# Patient Record
Sex: Female | Born: 1990 | Hispanic: No | Marital: Married | Smoking: Never smoker
Health system: Southern US, Community
[De-identification: ages and names within clinical notes are randomized; demographics above are authoritative.]

## PROBLEM LIST (undated history)

## (undated) ENCOUNTER — Inpatient Hospital Stay (HOSPITAL_COMMUNITY): Payer: Self-pay

## (undated) DIAGNOSIS — J45909 Unspecified asthma, uncomplicated: Secondary | ICD-10-CM

## (undated) DIAGNOSIS — Z789 Other specified health status: Secondary | ICD-10-CM

## (undated) DIAGNOSIS — E039 Hypothyroidism, unspecified: Secondary | ICD-10-CM

## (undated) DIAGNOSIS — E079 Disorder of thyroid, unspecified: Secondary | ICD-10-CM

## (undated) HISTORY — DX: Hypothyroidism, unspecified: E03.9

## (undated) HISTORY — PX: APPENDECTOMY: SHX54

## (undated) HISTORY — PX: TONSILLECTOMY: SUR1361

## (undated) HISTORY — DX: Unspecified asthma, uncomplicated: J45.909

---

## 2015-07-01 ENCOUNTER — Inpatient Hospital Stay (HOSPITAL_COMMUNITY)
Admission: AD | Admit: 2015-07-01 | Discharge: 2015-07-01 | Disposition: A | Discharge status: Home / Self Care | Payer: Medicaid Other | Source: Ambulatory Visit | Attending: Obstetrics & Gynecology | Admitting: Obstetrics & Gynecology

## 2015-07-01 ENCOUNTER — Inpatient Hospital Stay (HOSPITAL_COMMUNITY): Payer: Medicaid Other

## 2015-07-01 ENCOUNTER — Encounter (HOSPITAL_COMMUNITY): Payer: Self-pay | Admitting: *Deleted

## 2015-07-01 DIAGNOSIS — R102 Pelvic and perineal pain: Secondary | ICD-10-CM | POA: Insufficient documentation

## 2015-07-01 DIAGNOSIS — Z3A01 Less than 8 weeks gestation of pregnancy: Secondary | ICD-10-CM | POA: Diagnosis not present

## 2015-07-01 DIAGNOSIS — O9989 Other specified diseases and conditions complicating pregnancy, childbirth and the puerperium: Secondary | ICD-10-CM

## 2015-07-01 DIAGNOSIS — O26891 Other specified pregnancy related conditions, first trimester: Secondary | ICD-10-CM | POA: Insufficient documentation

## 2015-07-01 DIAGNOSIS — O26899 Other specified pregnancy related conditions, unspecified trimester: Secondary | ICD-10-CM

## 2015-07-01 DIAGNOSIS — R109 Unspecified abdominal pain: Secondary | ICD-10-CM

## 2015-07-01 HISTORY — DX: Other specified health status: Z78.9

## 2015-07-01 LAB — URINALYSIS, ROUTINE W REFLEX MICROSCOPIC
Bilirubin Urine: NEGATIVE
Glucose, UA: NEGATIVE mg/dL
Ketones, ur: NEGATIVE mg/dL
Nitrite: NEGATIVE
Protein, ur: NEGATIVE mg/dL
SPECIFIC GRAVITY, URINE: 1.01 (ref 1.005–1.030)
pH: 5.5 (ref 5.0–8.0)

## 2015-07-01 LAB — CBC
HEMATOCRIT: 34.4 % — AB (ref 36.0–46.0)
HEMOGLOBIN: 12 g/dL (ref 12.0–15.0)
MCH: 28.1 pg (ref 26.0–34.0)
MCHC: 34.9 g/dL (ref 30.0–36.0)
MCV: 80.6 fL (ref 78.0–100.0)
Platelets: 185 10*3/uL (ref 150–400)
RBC: 4.27 MIL/uL (ref 3.87–5.11)
RDW: 13.6 % (ref 11.5–15.5)
WBC: 6.6 10*3/uL (ref 4.0–10.5)

## 2015-07-01 LAB — URINE MICROSCOPIC-ADD ON: RBC / HPF: NONE SEEN RBC/hpf (ref 0–5)

## 2015-07-01 LAB — ABO/RH: ABO/RH(D): A POS

## 2015-07-01 LAB — HCG, QUANTITATIVE, PREGNANCY: hCG, Beta Chain, Quant, S: 2364 m[IU]/mL — ABNORMAL HIGH (ref <5)

## 2015-07-01 LAB — POCT PREGNANCY, URINE: PREG TEST UR: POSITIVE — AB

## 2015-07-01 NOTE — MAU Note (Signed)
Pt reports positive home preg test, reports pain in left lower abd and left lower back x one week. Pain worsened tonight.

## 2015-07-01 NOTE — MAU Provider Note (Signed)
History   [id number]   Chief Complaint  Patient presents with  . Pelvic Pain    HPI Casey Obrien is a 25 y.o. female  G1P0 here with report of lower pelvic pain that started a week ago and worsened in the past 24 hours.  Pain is located in lower midpelvic area and radiates to the lower back.  Denies vaginal bleeding or abnormal vaginal discharge.  No report of UTI symptoms.    Patient's last menstrual period was 06/01/2015.  OB History  Gravida Para Term Preterm AB SAB TAB Ectopic Multiple Living  1             # Outcome Date GA Lbr Len/2nd Weight Sex Delivery Anes PTL Lv  1 Current               Past Medical History  Diagnosis Date  . Medical history non-contributory     History reviewed. No pertinent family history.  Social History   Social History  . Marital Status: Married    Spouse Name: N/A  . Number of Children: N/A  . Years of Education: N/A   Social History Main Topics  . Smoking status: Never Smoker   . Smokeless tobacco: None  . Alcohol Use: No  . Drug Use: No  . Sexual Activity: Yes   Other Topics Concern  . None   Social History Narrative  . None    No Known Allergies  No current facility-administered medications on file prior to encounter.   No current outpatient prescriptions on file prior to encounter.     Review of Systems  Constitutional: Negative for fever and chills.  Gastrointestinal: Negative for nausea and vomiting.  Genitourinary: Positive for pelvic pain. Negative for dysuria, hematuria, vaginal bleeding and vaginal discharge.  Musculoskeletal: Positive for back pain.  All other systems reviewed and are negative.    Physical Exam   Filed Vitals:   07/01/15 0428  BP: 107/70  Pulse: 105  Temp: 98.3 F (36.8 C)  TempSrc: Oral  Resp: 20  Height: 4\' 11"  (1.499 m)  Weight: 132 lb (59.875 kg)  SpO2: 99%    Physical Exam  Constitutional: She is oriented to person, place, and time. She appears well-developed and  well-nourished. No distress.  Smiling during the exam  HENT:  Head: Normocephalic.  Neck: Normal range of motion. Neck supple.  Cardiovascular: Normal rate, regular rhythm and normal heart sounds.   Respiratory: Effort normal and breath sounds normal. No respiratory distress.  GI: Soft. She exhibits no mass. There is no tenderness. There is no rebound and no guarding.  Genitourinary: Right adnexum displays no mass, no tenderness and no fullness. Left adnexum displays no mass, no tenderness and no fullness. No bleeding in the vagina.  Musculoskeletal: Normal range of motion.  Neurological: She is alert and oriented to person, place, and time.  Skin: Skin is warm and dry.    MAU Course  Procedures  Results for orders placed or performed during the hospital encounter of 07/01/15 (from the past 24 hour(s))  Urinalysis, Routine w reflex microscopic (not [facility])     Status: Abnormal   Collection Time: 07/01/15  4:30 AM  Result Value Ref Range   Color, Urine YELLOW YELLOW   APPearance CLEAR CLEAR   Specific Gravity, Urine 1.010 1.005 - 1.030   pH 5.5 5.0 - 8.0   Glucose, UA NEGATIVE NEGATIVE mg/dL   Hgb urine dipstick TRACE (A) NEGATIVE   Bilirubin Urine NEGATIVE NEGATIVE  Ketones, ur NEGATIVE NEGATIVE mg/dL   Protein, ur NEGATIVE NEGATIVE mg/dL   Nitrite NEGATIVE NEGATIVE   Leukocytes, UA TRACE (A) NEGATIVE  Urine microscopic-add on     Status: Abnormal   Collection Time: 07/01/15  4:30 AM  Result Value Ref Range   Squamous Epithelial / LPF 0-5 (A) NONE SEEN   WBC, UA 0-5 0 - 5 WBC/hpf   RBC / HPF NONE SEEN 0 - 5 RBC/hpf   Bacteria, UA RARE (A) NONE SEEN  Pregnancy, urine POC     Status: Abnormal   Collection Time: 07/01/15  4:39 AM  Result Value Ref Range   Preg Test, Ur POSITIVE (A) NEGATIVE  CBC     Status: Abnormal   Collection Time: 07/01/15  4:56 AM  Result Value Ref Range   WBC 6.6 4.0 - 10.5 K/uL   RBC 4.27 3.87 - 5.11 MIL/uL   Hemoglobin 12.0 12.0 - 15.0 g/dL    HCT 65.7 (L) 84.6 - 46.0 %   MCV 80.6 78.0 - 100.0 fL   MCH 28.1 26.0 - 34.0 pg   MCHC 34.9 30.0 - 36.0 g/dL   RDW 96.2 95.2 - 84.1 %   Platelets 185 150 - 400 K/uL  hCG, quantitative, pregnancy     Status: Abnormal   Collection Time: 07/01/15  4:56 AM  Result Value Ref Range   hCG, Beta Chain, Quant, S 2364 (H) <5 mIU/mL  ABO/Rh     Status: None (Preliminary result)   Collection Time: 07/01/15  4:56 AM  Result Value Ref Range   ABO/RH(D) A POS    Ultrasound: IMPRESSION: 1. Intrauterine sac measuring 5 weeks 1 day. Yolk sac and embryo are not yet visible. 2. No adnexal mass or pelvic fluid.  Assessment and Plan  Pregnancy of Unknown Location - Probable IUP  Plan: Discharge home Follow-up [facility] on Wednesday at 1100 Reviewed ectopic precautions  All information communicated via language line interpreter [phone]  Marlis Edelson, CNM 07/01/2015 7:07 AM

## 2015-07-03 ENCOUNTER — Other Ambulatory Visit: Payer: Self-pay

## 2015-07-03 DIAGNOSIS — O3680X0 Pregnancy with inconclusive fetal viability, not applicable or unspecified: Secondary | ICD-10-CM

## 2015-07-04 LAB — HCG, QUANTITATIVE, PREGNANCY: HCG, BETA CHAIN, QUANT, S: 4465.9 m[IU]/mL — AB

## 2015-07-08 ENCOUNTER — Encounter: Payer: Self-pay | Admitting: General Practice

## 2015-07-08 DIAGNOSIS — Z3490 Encounter for supervision of normal pregnancy, unspecified, unspecified trimester: Secondary | ICD-10-CM

## 2015-07-08 NOTE — Progress Notes (Unsigned)
Patient came by clinic today with husband wanting test results. Informed her of [facility] results & repeat ultrasound scheduled for 6/14 with clinic appt to follow. She verbalized understanding & had no questions

## 2015-07-10 ENCOUNTER — Ambulatory Visit (HOSPITAL_COMMUNITY): Admission: RE | Admit: 2015-07-10 | Payer: Self-pay | Source: Ambulatory Visit

## 2015-07-10 ENCOUNTER — Encounter: Payer: Self-pay | Admitting: Family

## 2015-11-28 ENCOUNTER — Encounter (HOSPITAL_COMMUNITY): Payer: Self-pay

## 2015-11-28 ENCOUNTER — Inpatient Hospital Stay (HOSPITAL_COMMUNITY)
Admission: AD | Admit: 2015-11-28 | Discharge: 2015-11-28 | Disposition: A | Discharge status: Home / Self Care | Payer: Medicaid Other | Source: Ambulatory Visit | Attending: Obstetrics & Gynecology | Admitting: Obstetrics & Gynecology

## 2015-11-28 DIAGNOSIS — R233 Spontaneous ecchymoses: Secondary | ICD-10-CM | POA: Diagnosis not present

## 2015-11-28 DIAGNOSIS — R059 Cough, unspecified: Secondary | ICD-10-CM

## 2015-11-28 DIAGNOSIS — O26892 Other specified pregnancy related conditions, second trimester: Secondary | ICD-10-CM | POA: Diagnosis not present

## 2015-11-28 DIAGNOSIS — R05 Cough: Secondary | ICD-10-CM

## 2015-11-28 DIAGNOSIS — Z3A25 25 weeks gestation of pregnancy: Secondary | ICD-10-CM | POA: Insufficient documentation

## 2015-11-28 LAB — URINALYSIS, ROUTINE W REFLEX MICROSCOPIC
BILIRUBIN URINE: NEGATIVE
Glucose, UA: NEGATIVE mg/dL
Hgb urine dipstick: NEGATIVE
KETONES UR: NEGATIVE mg/dL
NITRITE: NEGATIVE
Protein, ur: NEGATIVE mg/dL
Specific Gravity, Urine: 1.01 (ref 1.005–1.030)
pH: 6 (ref 5.0–8.0)

## 2015-11-28 LAB — URINE MICROSCOPIC-ADD ON

## 2015-11-28 NOTE — Discharge Instructions (Signed)
Cough, Adult A cough helps to clear your throat and lungs. A cough may last only 2-3 weeks (acute), or it may last longer than 8 weeks (chronic). Many different things can cause a cough. A cough may be a sign of an illness or another medical condition. HOME CARE  Pay attention to any changes in your cough.  Take medicines only as told by your doctor.  If you were prescribed an antibiotic medicine, take it as told by your doctor. Do not stop taking it even if you start to feel better.  Talk with your doctor before you try using a cough medicine.  Drink enough fluid to keep your pee (urine) clear or pale yellow.  If the air is dry, use a cold steam vaporizer or humidifier in your home.  Stay away from things that make you cough at work or at home.  If your cough is worse at night, try using extra pillows to raise your head up higher while you sleep.  Do not smoke, and try not to be around smoke. If you need help quitting, ask your doctor.  Do not have caffeine.  Do not drink alcohol.  Rest as needed. GET HELP IF:  You have new problems (symptoms).  You cough up yellow fluid (pus).  Your cough does not get better after 2-3 weeks, or your cough gets worse.  Medicine does not help your cough and you are not sleeping well.  You have pain that gets worse or pain that is not helped with medicine.  You have a fever.  You are losing weight and you do not know why.  You have night sweats. GET HELP RIGHT AWAY IF:  You cough up blood.  You have trouble breathing.  Your heartbeat is very fast.   This information is not intended to replace advice given to you by your health care provider. Make sure you discuss any questions you have with your health care provider.   Document Released: 09/25/2010 Document Revised: 10/03/2014 Document Reviewed: 03/21/2014 Elsevier Interactive Patient Education 2016 Else[employer]. Guaifenesin oral solution and syrup What is this  medicine? GUAIFENESIN (gwye FEN e sin) is an expectorant. It helps to thin mucous and make coughs more productive. This medicine is used to treat coughs caused by colds or the flu. It is not intended to treat chronic cough caused by smoking, asthma, emphysema, or heart failure. This medicine may be used for other purposes; ask your health care provider or pharmacist if you have questions. What should I tell my health care provider before I take this medicine? They need to know if you have any of these conditions: -diabetes -fever -kidney disease -an unusual or allergic reaction to guaifenesin, other medicines, foods, dyes, or preservatives -pregnant or trying to get pregnant -breast-feeding How should I use this medicine? Take this medicine by mouth. Follow the directions on the prescription label. Use a specially marked spoon or container to measure your dose. Household spoons are not accurate. Take your medicine at regular intervals. Do not take it more often than directed. Talk to your pediatrician regarding the use of this medicine in children. Special care may be needed. Overdosage: If you think you have taken too much of this medicine contact a poison control center or emergency room at once. NOTE: This medicine is only for you. Do not share this medicine with others. What if I miss a dose? If you miss a dose, take it as soon as you can. If it  is almost time for your next dose, take only that dose. Do not take double or extra doses. What may interact with this medicine? Interactions are not expected. This list may not describe all possible interactions. Give your health care provider a list of all the medicines, herbs, non-prescription drugs, or dietary supplements you use. Also tell them if you smoke, drink alcohol, or use illegal drugs. Some items may interact with your medicine. What should I watch for while using this medicine? Do not treat a cough for more than 1 week without  consulting your doctor or health care professional. If you also have a high fever, skin rash, continuing headache, or sore throat, see your doctor. For best results, drink 6 to 8 glasses water daily while you are taking this medicine. What side effects may I notice from receiving this medicine? Side effects that you should report to your doctor or health care professional as soon as possible: -allergic reactions like skin rash, itching or hives, swelling of the face, lips, or tongue Side effects that usually do not require medical attention (report to your doctor or health care professional if they continue or are bothersome): -dizziness -headache -stomach upset This list may not describe all possible side effects. Call your doctor for medical advice about side effects. You may report side effects to FDA at 1-800-FDA-1088. Where should I keep my medicine? Keep out of the reach of children. Store at room temperature between 20 and 25 degrees C (68 and 77 degrees F). Do not freeze. Keep container tightly closed. Throw away any unused medicine after the expiration date. NOTE: This sheet is a summary. It may not cover all possible information. If you have questions about this medicine, talk to your doctor, pharmacist, or health care provider.    2016, Elsevier/Gold Standard. (2007-05-25 11:48:29)

## 2015-11-28 NOTE — MAU Provider Note (Signed)
History     CSN: [id number]  Arrival date and time: 11/28/15 0417   First Provider Initiated Contact with Patient 11/28/15 0502      Chief Complaint  Patient presents with  . Cough  . Epistaxis   Patient was seen and evaluated at the bedside. Patient presents to the MAU S/P completed 5 day of azithromycin for URI for N/V and cough. Patient states that earlier this morning she had a bout of nausea and vomiting which resulted in an episode of nosebleed and diffuse petechiae throughout her face with no other body involvement. Facial petechiae occurred 3 months ago after an episode of vomiting and subsequently resolved 3 days later. Patient would like to know what medicines are safe for her cough.     Past Medical History:  Diagnosis Date  . Medical history non-contributory     Past Surgical History:  Procedure Laterality Date  . APPENDECTOMY      No family history on file.  Social History  Substance Use Topics  . Smoking status: Never Smoker  . Smokeless tobacco: Not on file  . Alcohol use No    Allergies: No Known Allergies  Prescriptions Prior to Admission  Medication Sig Dispense Refill Last Dose  . azithromycin (ZITHROMAX) 250 MG tablet Take by mouth daily.   11/27/2015 at Unknown time  . loratadine (CLARITIN) 10 MG tablet Take 10 mg by mouth daily.   11/27/2015 at Unknown time    Review of Systems  Constitutional: Negative for chills.  Eyes: Negative for blurred vision.  Respiratory: Positive for cough. Negative for sputum production, shortness of breath and wheezing.   Cardiovascular: Negative for chest pain.  Gastrointestinal: Positive for nausea and vomiting. Negative for abdominal pain, constipation and diarrhea.  Genitourinary: Negative for dysuria.  Musculoskeletal: Negative for myalgias.  Skin: Positive for rash. Negative for itching.  Neurological: Negative for dizziness and headaches.  Psychiatric/Behavioral: Negative for depression.   Physical Exam    Blood pressure 102/70, pulse 110, temperature 98.4 F (36.9 C), temperature source Oral, resp. rate 16, height 5' (1.524 m), weight 146 lb (66.2 kg), last menstrual period 06/01/2015, SpO2 98 %.  Physical Exam  Constitutional: She is oriented to person, place, and time. She appears well-developed and well-nourished. She appears distressed.  HENT:  Head: Normocephalic and atraumatic.  Mouth/Throat: Oropharynx is clear and moist. No oropharyngeal exudate.  Eyes: Conjunctivae and EOM are normal.  Cardiovascular: Normal rate, regular rhythm and intact distal pulses.   Respiratory: Effort normal and breath sounds normal. No respiratory distress. She has no wheezes. She has no rales. She exhibits tenderness.  GI: There is no tenderness.  Musculoskeletal: Normal range of motion.  Neurological: She is alert and oriented to person, place, and time. She has normal reflexes.  Skin: She is not diaphoretic.  Diffuse petechiae through face  Psychiatric: She has a normal mood and affect. Her behavior is normal. Judgment and thought content normal.    MAU Course  Procedures  MDM Patient being evaluated for petechiae post vomiting in the setting of a non productive cough without fever, myalgias, shortness of breath, or sick contacts  Assessment and Plan  25 yo G1P0 @ 25 5/7 who presents to the MAU for Petechiae after vomiting. Petechiae improved during the evaluation with no signs or symptoms of bacterial URI. Will discharge patient with list of pregnancy safe medications for Cough and Nausea. Petechiae likely to resolve over the course of the next few days. Patient encouraged to return to  the MAU if symptoms fail to improve or if petechiae spread  Josue D Santos 11/28/2015, 5:35 AM   I have participated in the care of this patient and I agree with the above. Cam HaiSHAW, Neka Bise CNM 7:43 PM 11/29/2015

## 2015-11-28 NOTE — MAU Note (Signed)
Pt c/o cough x3 weeks. Coughed so much tonight that she vomited x1 and had nosebleed x1. States was prescribed cough medicine but has not helped. Sometimes coughing up mucous. Denies being around anyone sick or fever at home. Denies contractions or vag bleeding. +FM.

## 2016-05-05 ENCOUNTER — Encounter (HOSPITAL_COMMUNITY): Payer: Self-pay

## 2016-10-01 ENCOUNTER — Encounter (HOSPITAL_COMMUNITY): Payer: Self-pay | Admitting: *Deleted

## 2016-10-01 ENCOUNTER — Inpatient Hospital Stay (HOSPITAL_COMMUNITY)
Admission: AD | Admit: 2016-10-01 | Discharge: 2016-10-01 | Disposition: A | Discharge status: Home / Self Care | Payer: Medicaid Other | Source: Ambulatory Visit | Attending: Obstetrics and Gynecology | Admitting: Obstetrics and Gynecology

## 2016-10-01 DIAGNOSIS — Z3A01 Less than 8 weeks gestation of pregnancy: Secondary | ICD-10-CM | POA: Diagnosis not present

## 2016-10-01 DIAGNOSIS — O26891 Other specified pregnancy related conditions, first trimester: Secondary | ICD-10-CM | POA: Diagnosis not present

## 2016-10-01 DIAGNOSIS — R1010 Upper abdominal pain, unspecified: Secondary | ICD-10-CM | POA: Diagnosis present

## 2016-10-01 DIAGNOSIS — N898 Other specified noninflammatory disorders of vagina: Secondary | ICD-10-CM | POA: Insufficient documentation

## 2016-10-01 DIAGNOSIS — K219 Gastro-esophageal reflux disease without esophagitis: Secondary | ICD-10-CM | POA: Insufficient documentation

## 2016-10-01 DIAGNOSIS — O99611 Diseases of the digestive system complicating pregnancy, first trimester: Secondary | ICD-10-CM | POA: Insufficient documentation

## 2016-10-01 LAB — URINALYSIS, ROUTINE W REFLEX MICROSCOPIC
Bacteria, UA: NONE SEEN
Bilirubin Urine: NEGATIVE
GLUCOSE, UA: NEGATIVE mg/dL
Hgb urine dipstick: NEGATIVE
Ketones, ur: NEGATIVE mg/dL
Nitrite: NEGATIVE
PH: 6 (ref 5.0–8.0)
Protein, ur: NEGATIVE mg/dL
Specific Gravity, Urine: 1.024 (ref 1.005–1.030)

## 2016-10-01 LAB — CBC
HEMATOCRIT: 34.6 % — AB (ref 36.0–46.0)
HEMOGLOBIN: 12 g/dL (ref 12.0–15.0)
MCH: 27.1 pg (ref 26.0–34.0)
MCHC: 34.7 g/dL (ref 30.0–36.0)
MCV: 78.3 fL (ref 78.0–100.0)
PLATELETS: 189 10*3/uL (ref 150–400)
RBC: 4.42 MIL/uL (ref 3.87–5.11)
RDW: 13.5 % (ref 11.5–15.5)
WBC: 7.8 10*3/uL (ref 4.0–10.5)

## 2016-10-01 LAB — COMPREHENSIVE METABOLIC PANEL
ALK PHOS: 43 U/L (ref 38–126)
ALT: 14 U/L (ref 14–54)
AST: 12 U/L — AB (ref 15–41)
Albumin: 4 g/dL (ref 3.5–5.0)
Anion gap: 8 (ref 5–15)
BUN: 16 mg/dL (ref 6–20)
CALCIUM: 9.3 mg/dL (ref 8.9–10.3)
CHLORIDE: 105 mmol/L (ref 101–111)
CO2: 23 mmol/L (ref 22–32)
CREATININE: 0.56 mg/dL (ref 0.44–1.00)
GFR calc Af Amer: >60 mL/min (ref >60)
Glucose, Bld: 101 mg/dL — ABNORMAL HIGH (ref 65–99)
Potassium: 4.3 mmol/L (ref 3.5–5.1)
Sodium: 136 mmol/L (ref 135–145)
Total Bilirubin: 0.6 mg/dL (ref 0.3–1.2)
Total Protein: 7.1 g/dL (ref 6.5–8.1)

## 2016-10-01 LAB — WET PREP, GENITAL
Clue Cells Wet Prep HPF POC: NONE SEEN
SPERM: NONE SEEN
TRICH WET PREP: NONE SEEN
YEAST WET PREP: NONE SEEN

## 2016-10-01 LAB — POCT PREGNANCY, URINE: Preg Test, Ur: POSITIVE — AB

## 2016-10-01 LAB — LIPASE, BLOOD: LIPASE: 34 U/L (ref 11–51)

## 2016-10-01 MED ORDER — RANITIDINE HCL 150 MG PO TABS: 150.0000 mg | ORAL_TABLET | Freq: Every day | ORAL | 1 refills | Status: DC

## 2016-10-01 NOTE — MAU Provider Note (Signed)
History     CSN: [id number]  Arrival date and time: 10/01/16 1613   First Provider Initiated Contact with Patient 10/01/16 1708      Chief Complaint  Patient presents with  . Abdominal Pain  . Vaginal Discharge  . Vaginal Itching  . Possible Pregnancy   G2P1001 @[redacted]w[redacted]d  here with upper abdominal pain and vaginal itching. Abdominal pain started 2 mos ago and located in epigastric region. Pain only occurs after eating. Describes as squeezing. Some nausea since found out she was pregnant, no vomiting. No fevers. Also c/o vaginal itching and white discharge x1 week. No odor. No lower abdominal pain. No vaginal discharge.    OB History    Gravida Para Term Preterm AB Living   2         1   SAB TAB Ectopic Multiple Live Births           1      Past Medical History:  Diagnosis Date  . Medical history non-contributory     Past Surgical History:  Procedure Laterality Date  . APPENDECTOMY    . CESAREAN SECTION     cervix not dilating   . TONSILLECTOMY      No family history on file.  Social History  Substance Use Topics  . Smoking status: Never Smoker  . Smokeless tobacco: Never Used  . Alcohol use No    Allergies: No Known Allergies  Prescriptions Prior to Admission  Medication Sig Dispense Refill Last Dose  . loratadine (CLARITIN) 10 MG tablet Take 10 mg by mouth daily.   11/27/2015 at Unknown time    Review of Systems  Respiratory: Negative for chest tightness and shortness of breath.   Cardiovascular: Negative for chest pain.  Gastrointestinal: Positive for abdominal pain and nausea. Negative for vomiting.  Genitourinary: Positive for dysuria and frequency. Negative for hematuria and urgency.   Physical Exam   Blood pressure 107/61, pulse 98, temperature 97.9 F (36.6 C), temperature source Oral, resp. rate 16, height 5' 0.63" (1.54 m), weight 138 lb 14.2 oz (63 kg), last menstrual period 08/10/2016, unknown if currently breastfeeding.  Physical Exam   Constitutional: She is oriented to person, place, and time. She appears well-developed and well-nourished. No distress.  HENT:  Head: Normocephalic and atraumatic.  Neck: Normal range of motion.  Respiratory: Effort normal.  GI: Soft. She exhibits no distension and no mass. There is tenderness in the epigastric area. There is no rebound and no guarding.  Genitourinary:  Genitourinary Comments: External: no lesions or erythema Vagina: rugated, pink, moist, thin white discharge    Musculoskeletal: Normal range of motion.  Neurological: She is alert and oriented to person, place, and time.  Skin: Skin is warm and dry.  Psychiatric: She has a normal mood and affect.   Results for orders placed or performed during the hospital encounter of 10/01/16 (from the past 24 hour(s))  Wet prep, genital     Status: Abnormal   Collection Time: 10/01/16  4:46 PM  Result Value Ref Range   Yeast Wet Prep HPF POC NONE SEEN NONE SEEN   Trich, Wet Prep NONE SEEN NONE SEEN   Clue Cells Wet Prep HPF POC NONE SEEN NONE SEEN   WBC, Wet Prep HPF POC MANY (A) NONE SEEN   Sperm NONE SEEN   Urinalysis, Routine w reflex microscopic     Status: Abnormal   Collection Time: 10/01/16  5:00 PM  Result Value Ref Range   Color, Urine YELLOW  YELLOW   APPearance HAZY (A) CLEAR   Specific Gravity, Urine 1.024 1.005 - 1.030   pH 6.0 5.0 - 8.0   Glucose, UA NEGATIVE NEGATIVE mg/dL   Hgb urine dipstick NEGATIVE NEGATIVE   Bilirubin Urine NEGATIVE NEGATIVE   Ketones, ur NEGATIVE NEGATIVE mg/dL   Protein, ur NEGATIVE NEGATIVE mg/dL   Nitrite NEGATIVE NEGATIVE   Leukocytes, UA MODERATE (A) NEGATIVE   RBC / HPF 0-5 0 - 5 RBC/hpf   WBC, UA 0-5 0 - 5 WBC/hpf   Bacteria, UA NONE SEEN NONE SEEN   Squamous Epithelial / LPF 0-5 (A) NONE SEEN   Mucus PRESENT   Pregnancy, urine POC     Status: Abnormal   Collection Time: 10/01/16  5:07 PM  Result Value Ref Range   Preg Test, Ur POSITIVE (A) NEGATIVE  CBC     Status:  Abnormal   Collection Time: 10/01/16  5:32 PM  Result Value Ref Range   WBC 7.8 4.0 - 10.5 K/uL   RBC 4.42 3.87 - 5.11 MIL/uL   Hemoglobin 12.0 12.0 - 15.0 g/dL   HCT 16.1 (L) 09.6 - 04.5 %   MCV 78.3 78.0 - 100.0 fL   MCH 27.1 26.0 - 34.0 pg   MCHC 34.7 30.0 - 36.0 g/dL   RDW 40.9 81.1 - 91.4 %   Platelets 189 150 - 400 K/uL  Comprehensive metabolic panel     Status: Abnormal   Collection Time: 10/01/16  5:32 PM  Result Value Ref Range   Sodium 136 135 - 145 mmol/L   Potassium 4.3 3.5 - 5.1 mmol/L   Chloride 105 101 - 111 mmol/L   CO2 23 22 - 32 mmol/L   Glucose, Bld 101 (H) 65 - 99 mg/dL   BUN 16 6 - 20 mg/dL   Creatinine, Ser 7.82 0.44 - 1.00 mg/dL   Calcium 9.3 8.9 - 95.6 mg/dL   Total Protein 7.1 6.5 - 8.1 g/dL   Albumin 4.0 3.5 - 5.0 g/dL   AST 12 (L) 15 - 41 U/L   ALT 14 14 - 54 U/L   Alkaline Phosphatase 43 38 - 126 U/L   Total Bilirubin 0.6 0.3 - 1.2 mg/dL   GFR calc non Af Amer >60 >60 mL/min   GFR calc Af Amer >60 >60 mL/min   Anion gap 8 5 - 15  Lipase, blood     Status: None   Collection Time: 10/01/16  5:32 PM  Result Value Ref Range   Lipase 34 11 - 51 U/L   MAU Course  Procedures  MDM Labs ordered and reviewed. No evidence of emergent abdominal process. No evidence of vaginal infection or UTI. Upper abd pain likely GERD. Will Rx Zantac. Stable for discharge home.  Assessment and Plan   1. [redacted] weeks gestation of pregnancy   2. Vaginal discharge during pregnancy in first trimester   3. Gastroesophageal reflux disease without esophagitis    Discharge home Follow up with OBGYN provider of choice to start care B6 and Unisom prn nausea Rx Zantac Return precautions  Allergies as of 10/01/2016   No Known Allergies     Medication List    TAKE these medications   loratadine 10 MG tablet Commonly known as:  CLARITIN Take 10 mg by mouth daily.   ranitidine 150 MG tablet Commonly known as:  ZANTAC Take 1 tablet (150 mg total) by mouth at bedtime.             Discharge  Care Instructions        Start     Ordered   10/01/16 0000  Discharge patient    Question Answer Comment  Discharge disposition 01-Home or Self Care   Discharge patient date 10/01/2016      10/01/16 1816   10/01/16 0000  ranitidine (ZANTAC) 150 MG tablet  Daily at bedtime    Question:  Supervising Provider  Answer:  Catalina Antigua   10/01/16 1816     Donette Larry, CNM 10/01/2016, 5:28 PM

## 2016-10-01 NOTE — MAU Note (Addendum)
Pt presents to MAU c/o abdominal and vaginal pain that started around 2months ago. Pt states her pain worsened today which made her come to the hospital. Pt states the abdominal pain is 7-8 and worsens when she eats. Pt states her abdominal pain is directly under her sternum and like pressure, pain and feels like her stomach is getting squeezed.   Pt reports the pain in her vaginal area burns when she pees and she is having itching. Pt states the itching is inner and outer. Pt reports vaginal discharge that is white/yellow and thick.   Pt states her LMP was July 6th 2018

## 2016-10-01 NOTE — Discharge Instructions (Signed)
Heartburn During Pregnancy Heartburn is pain or discomfort in the throat or chest. It may cause a burning feeling. It happens when stomach acid moves up into the tube that carries food from your mouth to your stomach (esophagus). Heartburn is common during pregnancy. It usually goes away or gets better after giving birth. Follow these instructions at home: Eating and drinking  Do not drink alcohol while you are pregnant.  Figure out which foods and beverages make you feel worse, and avoid them.  Beverages that you may want to avoid include: ? Coffee and tea (with or without caffeine). ? Energy drinks and sports drinks. ? Bubbly (carbonated) drinks or sodas. ? Citrus fruit juices.  Foods that you may want to avoid include: ? Chocolate and cocoa. ? Peppermint and mint flavorings. ? Garlic, onions, and horseradish. ? Spicy and acidic foods. These include peppers, chili powder, curry powder, vinegar, hot sauces, and barbecue sauce. ? Citrus fruits, such as oranges, lemons, and limes. ? Tomato-based foods, such as red sauce, chili, and salsa. ? Fried and fatty foods, such as donuts, french fries, potato chips, and high-fat dressings. ? High-fat meats, such as hot dogs, cold cuts, sausage, ham, and bacon. ? High-fat dairy items, such as whole milk, butter, and cheese.  Eat small meals often, instead of large meals.  Avoid drinking a lot of liquid with your meals.  Avoid eating meals during the 2-3 hours before you go to bed.  Avoid lying down right after you eat.  Do not exercise right after you eat. Medicines  Take over-the-counter and prescription medicines only as told by your doctor.  Do not take aspirin, ibuprofen, or other NSAIDs unless your doctor tells you to do that.  Your doctor may tell you to avoid medicines that have sodium bicarbonate in them. General instructions  If told, raise the head of your bed about 6 inches (15 cm). You can do this by putting blocks under  the legs. Sleeping with more pillows does not help with heartburn.  Do not use any products that contain nicotine or tobacco, such as cigarettes and e-cigarettes. If you need help quitting, ask your doctor.  Wear loose-fitting clothing.  Try to lower your stress, such as with yoga or meditation. If you need help, ask your doctor.  Stay at a healthy weight. If you are overweight, work with your doctor to safely lose weight.  Keep all follow-up visits as told by your doctor. This is important. Contact a doctor if:  You get new symptoms.  Your symptoms do not get better with treatment.  You have weight loss and you do not know why.  You have trouble swallowing.  You make loud sounds when you breathe (wheeze).  You have a cough that does not go away.  You have heartburn often for more than 2 weeks.  You feel sick to your stomach (nauseous), and this does not get better with treatment.  You are throwing up (vomiting), and this does not get better with treatment.  You have pain in your belly (abdomen). Get help right away if:  You have very bad chest pain that spreads to your arm, neck, or jaw.  You feel sweaty, dizzy, or light-headed.  You have trouble breathing.  You have pain when swallowing.  You throw up and your throw-up looks like blood or coffee grounds.  Your poop (stool) is bloody or black. This information is not intended to replace advice given to you by your health care provider.  Make sure you discuss any questions you have with your health care provider. Document Released: 02/14/2010 Document Revised: 09/30/2015 Document Reviewed: 09/30/2015 Elsevier Interactive Patient Education  2017 [employer] Food Choices for Gastroesophageal Reflux Disease, Adult When you have gastroesophageal reflux disease (GERD), the foods you eat and your eating habits are very important. Choosing the right foods can help ease your discomfort. What guidelines do I need to  follow?  Choose fruits, vegetables, whole grains, and low-fat dairy products.  Choose low-fat meat, fish, and poultry.  Limit fats such as oils, salad dressings, butter, nuts, and avocado.  Keep a food diary. This helps you identify foods that cause symptoms.  Avoid foods that cause symptoms. These may be different for everyone.  Eat small meals often instead of 3 large meals a day.  Eat your meals slowly, in a place where you are relaxed.  Limit fried foods.  Cook foods using methods other than frying.  Avoid drinking alcohol.  Avoid drinking large amounts of liquids with your meals.  Avoid bending over or lying down until 2-3 hours after eating. What foods are not recommended? These are some foods and drinks that may make your symptoms worse: Vegetables Tomatoes. Tomato juice. Tomato and spaghetti sauce. Chili peppers. Onion and garlic. Horseradish. Fruits Oranges, grapefruit, and lemon (fruit and juice). Meats High-fat meats, fish, and poultry. This includes hot dogs, ribs, ham, sausage, salami, and bacon. Dairy Whole milk and chocolate milk. Sour cream. Cream. Butter. Ice cream. Cream cheese. Drinks Coffee and tea. Bubbly (carbonated) drinks or energy drinks. Condiments Hot sauce. Barbecue sauce. Sweets/Desserts Chocolate and cocoa. Donuts. Peppermint and spearmint. Fats and Oils High-fat foods. This includes [place] fries and potato chips. Other Vinegar. Strong spices. This includes black pepper, white pepper, red pepper, cayenne, curry powder, cloves, ginger, and chili powder. The items listed above may not be a complete list of foods and drinks to avoid. Contact your dietitian for more information. This information is not intended to replace advice given to you by your health care provider. Make sure you discuss any questions you have with your health care provider. Document Released: 07/14/2011 Document Revised: 06/20/2015 Document Reviewed: 11/16/2012 Elsevier  Interactive Patient Education  2017 [employer]

## 2016-10-02 LAB — GC/CHLAMYDIA PROBE AMP (~~LOC~~) NOT AT ARMC
Chlamydia: NEGATIVE
NEISSERIA GONORRHEA: NEGATIVE

## 2016-10-19 ENCOUNTER — Inpatient Hospital Stay (HOSPITAL_COMMUNITY)
Admission: AD | Admit: 2016-10-19 | Discharge: 2016-10-19 | Disposition: A | Discharge status: Home / Self Care | Payer: Medicaid Other | Source: Ambulatory Visit | Attending: Family Medicine | Admitting: Family Medicine

## 2016-10-19 ENCOUNTER — Encounter (HOSPITAL_COMMUNITY): Payer: Self-pay

## 2016-10-19 DIAGNOSIS — O219 Vomiting of pregnancy, unspecified: Secondary | ICD-10-CM

## 2016-10-19 DIAGNOSIS — O210 Mild hyperemesis gravidarum: Secondary | ICD-10-CM | POA: Diagnosis present

## 2016-10-19 DIAGNOSIS — Z3A10 10 weeks gestation of pregnancy: Secondary | ICD-10-CM | POA: Insufficient documentation

## 2016-10-19 LAB — COMPREHENSIVE METABOLIC PANEL
ALBUMIN: 4.1 g/dL (ref 3.5–5.0)
ALK PHOS: 43 U/L (ref 38–126)
ALT: 12 U/L — ABNORMAL LOW (ref 14–54)
ANION GAP: 12 (ref 5–15)
AST: 15 U/L (ref 15–41)
BUN: 10 mg/dL (ref 6–20)
CALCIUM: 9 mg/dL (ref 8.9–10.3)
CHLORIDE: 104 mmol/L (ref 101–111)
CO2: 20 mmol/L — AB (ref 22–32)
Creatinine, Ser: 0.47 mg/dL (ref 0.44–1.00)
GFR calc non Af Amer: >60 mL/min (ref >60)
GLUCOSE: 79 mg/dL (ref 65–99)
Potassium: 3.6 mmol/L (ref 3.5–5.1)
SODIUM: 136 mmol/L (ref 135–145)
Total Bilirubin: 1.3 mg/dL — ABNORMAL HIGH (ref 0.3–1.2)
Total Protein: 7.2 g/dL (ref 6.5–8.1)

## 2016-10-19 LAB — URINALYSIS, ROUTINE W REFLEX MICROSCOPIC
BACTERIA UA: NONE SEEN
Bilirubin Urine: NEGATIVE
GLUCOSE, UA: 50 mg/dL — AB
Hgb urine dipstick: NEGATIVE
KETONES UR: 80 mg/dL — AB
Nitrite: NEGATIVE
PROTEIN: 30 mg/dL — AB
Specific Gravity, Urine: 1.026 (ref 1.005–1.030)
pH: 6 (ref 5.0–8.0)

## 2016-10-19 LAB — CBC
HEMATOCRIT: 36.1 % (ref 36.0–46.0)
HEMOGLOBIN: 12.3 g/dL (ref 12.0–15.0)
MCH: 26.7 pg (ref 26.0–34.0)
MCHC: 34.1 g/dL (ref 30.0–36.0)
MCV: 78.5 fL (ref 78.0–100.0)
Platelets: 184 10*3/uL (ref 150–400)
RBC: 4.6 MIL/uL (ref 3.87–5.11)
RDW: 13.9 % (ref 11.5–15.5)
WBC: 6.4 10*3/uL (ref 4.0–10.5)

## 2016-10-19 MED ORDER — METOCLOPRAMIDE HCL 10 MG PO TABS: 10.0000 mg | ORAL_TABLET | Freq: Three times a day (TID) | ORAL | 1 refills | Status: DC

## 2016-10-19 MED ORDER — METOCLOPRAMIDE HCL 5 MG/ML IJ SOLN
10.0000 mg | Freq: Once | INTRAMUSCULAR | Status: AC
  Administered 2016-10-19: 10 mg via INTRAVENOUS
  Filled 2016-10-19: qty 2

## 2016-10-19 MED ORDER — FAMOTIDINE IN NACL 20-0.9 MG/50ML-% IV SOLN
20.0000 mg | Freq: Once | INTRAVENOUS | Status: AC
  Administered 2016-10-19: 20 mg via INTRAVENOUS
  Filled 2016-10-19: qty 50

## 2016-10-19 MED ORDER — LACTATED RINGERS IV BOLUS (SEPSIS)
1000.0000 mL | Freq: Once | INTRAVENOUS | Status: AC
  Administered 2016-10-19: 1000 mL via INTRAVENOUS

## 2016-10-19 MED ORDER — DEXTROSE 5 % IN LACTATED RINGERS IV BOLUS
1000.0000 mL | Freq: Once | INTRAVENOUS | Status: AC
  Administered 2016-10-19: 1000 mL via INTRAVENOUS

## 2016-10-19 NOTE — MAU Provider Note (Signed)
History     CSN: [id number]  Arrival date and time: 10/19/16 1240   First Provider Initiated Contact with Patient 10/19/16 1411      Chief Complaint  Patient presents with  . Emesis During Pregnancy  . Loss of Consciousness   HPI   Casey Obrien is a 26 y.o. female G2P0 @ 39w0dhere in MAU with N/V. States she has vomited 7 times in the last 24 hours. She is taking unsiom and B6 every night before bed and zantac. Denies fever or diarrhea. She has not started prenatal care. States she has occasional lower abdominal cramping that comes and goes. Vomiting makes the abdominal pain worse. Denies vaginal bleeding.   OB History    Gravida Para Term Preterm AB Living   2         1   SAB TAB Ectopic Multiple Live Births           1      Past Medical History:  Diagnosis Date  . Medical history non-contributory     Past Surgical History:  Procedure Laterality Date  . APPENDECTOMY    . CESAREAN SECTION     cervix not dilating   . TONSILLECTOMY      History reviewed. No pertinent family history.  Social History  Substance Use Topics  . Smoking status: Never Smoker  . Smokeless tobacco: Never Used  . Alcohol use No    Allergies: No Known Allergies  Prescriptions Prior to Admission  Medication Sig Dispense Refill Last Dose  . loratadine (CLARITIN) 10 MG tablet Take 10 mg by mouth daily.   11/27/2015 at Unknown time  . ranitidine (ZANTAC) 150 MG tablet Take 1 tablet (150 mg total) by mouth at bedtime. 30 tablet 1    Results for orders placed or performed during the hospital encounter of 10/19/16 (from the past 48 hour(s))  Comprehensive metabolic panel     Status: Abnormal   Collection Time: 10/19/16  1:53 PM  Result Value Ref Range   Sodium 136 135 - 145 mmol/L   Potassium 3.6 3.5 - 5.1 mmol/L   Chloride 104 101 - 111 mmol/L   CO2 20 (L) 22 - 32 mmol/L   Glucose, Bld 79 65 - 99 mg/dL   BUN 10 6 - 20 mg/dL   Creatinine, Ser 0.47 0.44 - 1.00 mg/dL   Calcium 9.0 8.9 -  10.3 mg/dL   Total Protein 7.2 6.5 - 8.1 g/dL   Albumin 4.1 3.5 - 5.0 g/dL   AST 15 15 - 41 U/L   ALT 12 (L) 14 - 54 U/L   Alkaline Phosphatase 43 38 - 126 U/L   Total Bilirubin 1.3 (H) 0.3 - 1.2 mg/dL   GFR calc non Af Amer >60 >60 mL/min   GFR calc Af Amer >60 >60 mL/min    Comment: (NOTE) The eGFR has been calculated using the CKD EPI equation. This calculation has not been validated in all clinical situations. eGFR's persistently <60 mL/min signify possible Chronic Kidney Disease.    Anion gap 12 5 - 15  CBC     Status: None   Collection Time: 10/19/16  1:53 PM  Result Value Ref Range   WBC 6.4 4.0 - 10.5 K/uL   RBC 4.60 3.87 - 5.11 MIL/uL   Hemoglobin 12.3 12.0 - 15.0 g/dL   HCT 36.1 36.0 - 46.0 %   MCV 78.5 78.0 - 100.0 fL   MCH 26.7 26.0 - 34.0 pg   MCHC  34.1 30.0 - 36.0 g/dL   RDW 13.9 11.5 - 15.5 %   Platelets 184 150 - 400 K/uL  Urinalysis, Routine w reflex microscopic     Status: Abnormal   Collection Time: 10/19/16  4:10 PM  Result Value Ref Range   Color, Urine YELLOW YELLOW   APPearance HAZY (A) CLEAR   Specific Gravity, Urine 1.026 1.005 - 1.030   pH 6.0 5.0 - 8.0   Glucose, UA 50 (A) NEGATIVE mg/dL   Hgb urine dipstick NEGATIVE NEGATIVE   Bilirubin Urine NEGATIVE NEGATIVE   Ketones, ur 80 (A) NEGATIVE mg/dL   Protein, ur 30 (A) NEGATIVE mg/dL   Nitrite NEGATIVE NEGATIVE   Leukocytes, UA LARGE (A) NEGATIVE   RBC / HPF 0-5 0 - 5 RBC/hpf   WBC, UA 6-30 0 - 5 WBC/hpf   Bacteria, UA NONE SEEN NONE SEEN   Squamous Epithelial / LPF 6-30 (A) NONE SEEN   Mucus PRESENT    Review of Systems  Constitutional: Negative for fever.  Gastrointestinal: Positive for abdominal pain (Cramping in lower abdomen), nausea and vomiting. Negative for diarrhea.   Physical Exam   Blood pressure 119/60, pulse 93, temperature 98.6 F (37 C), temperature source Oral, resp. rate 18, last menstrual period 08/10/2016, SpO2 100 %, unknown if currently breastfeeding.  Physical  Exam  Constitutional: She is oriented to person, place, and time. She appears well-developed and well-nourished. No distress.  HENT:  Head: Normocephalic.  Eyes: Pupils are equal, round, and reactive to light.  Respiratory: Effort normal.  GI: Soft. Normal appearance. There is tenderness in the suprapubic area. There is no rigidity, no rebound and no guarding.  Musculoskeletal: Normal range of motion.  Neurological: She is alert and oriented to person, place, and time.  Skin: Skin is warm. She is not diaphoretic.  Psychiatric: Her behavior is normal.    MAU Course  Procedures  None  MDM  + fetal heart tones via doppler CMP LR bolus X1 D5LR bolus X 1 Pepcid, Reglan IV Patient feeling much better after IV fluids, ready to go home Urine culture pending   Assessment and Plan   A:  1. Nausea and vomiting in pregnancy prior to [redacted] weeks gestation     P:  Discharge home in stable condition Rx: Reglan  Return to MAU as needed, if symptoms worsen Small, frequent meals  Start prenatal care.    Lezlie Lye, NP 10/19/2016 5:17 PM

## 2016-10-19 NOTE — Discharge Instructions (Signed)
Nausea and Vomiting, Adult Nausea is the feeling that you have an upset stomach or have to vomit. As nausea gets worse, it can lead to vomiting. Vomiting occurs when stomach contents are thrown up and out of the mouth. Vomiting can make you feel weak and cause you to become dehydrated. Dehydration can make you tired and thirsty, cause you to have a dry mouth, and decrease how often you urinate. Older adults and people with other diseases or a weak immune system are at higher risk for dehydration. It is important to treat your nausea and vomiting as told by your health care provider. Follow these instructions at home: Follow instructions from your health care provider about how to care for yourself at home. Eating and drinking Follow these recommendations as told by your health care provider:  Take an oral rehydration solution (ORS). This is a drink that is sold at pharmacies and retail stores.  Drink clear fluids in small amounts as you are able. Clear fluids include water, ice chips, diluted fruit juice, and low-calorie sports drinks.  Eat bland, easy-to-digest foods in small amounts as you are able. These foods include bananas, applesauce, rice, lean meats, toast, and crackers.  Avoid fluids that contain a lot of sugar or caffeine, such as energy drinks, sports drinks, and soda.  Avoid alcohol.  Avoid spicy or fatty foods.  General instructions  Drink enough fluid to keep your urine clear or pale yellow.  Wash your hands often. If soap and water are not available, use hand sanitizer.  Make sure that all people in your household wash their hands well and often.  Take over-the-counter and prescription medicines only as told by your health care provider.  Rest at home while you recover.  Watch your condition for any changes.  Breathe slowly and deeply when you feel nauseated.  Keep all follow-up visits as told by your health care provider. This is important. Contact a health care  provider if:  You have a fever.  You cannot keep fluids down.  Your symptoms get worse.  You have new symptoms.  Your nausea does not go away after two days.  You feel light-headed or dizzy.  You have a headache.  You have muscle cramps. Get help right away if:  You have pain in your chest, neck, arm, or jaw.  You feel extremely weak or you faint.  You have persistent vomiting.  You see blood in your vomit.  Your vomit looks like black coffee grounds.  You have bloody or black stools or stools that look like tar.  You have a severe headache, a stiff neck, or both.  You have a rash.  You have severe pain, cramping, or bloating in your abdomen.  You have trouble breathing or you are breathing very quickly.  Your heart is beating very quickly.  Your skin feels cold and clammy.  You feel confused.  You have pain when you urinate.  You have signs of dehydration, such as: ? Dark urine, very little urine, or no urine. ? Cracked lips. ? Dry mouth. ? Sunken eyes. ? Sleepiness. ? Weakness. These symptoms may represent a serious problem that is an emergency. Do not wait to see if the symptoms will go away. Get medical help right away. Call your local emergency services (911 in the [place] Do not drive yourself to the hospital. This information is not intended to replace advice given to you by your health care provider. Make sure you discuss any questions you   have with your health care provider. Document Released: 01/12/2005 Document Revised: 06/17/2015 Document Reviewed: 09/18/2014 Elsevier Interactive Patient Education  2017 Elsevier Inc.  

## 2016-10-19 NOTE — MAU Note (Signed)
Pt has had N/V since last night. Husband stated she has no energy. Felt like she was gong to pass out.  Husband started to carry her int triage because she felt like she could not walk.Her throat is sore from vomiting.

## 2016-10-20 LAB — CULTURE, OB URINE
Culture: <10000 — AB
Special Requests: NORMAL

## 2016-12-03 ENCOUNTER — Ambulatory Visit (INDEPENDENT_AMBULATORY_CARE_PROVIDER_SITE_OTHER): Payer: Medicaid Other | Admitting: Student

## 2016-12-03 ENCOUNTER — Other Ambulatory Visit (HOSPITAL_COMMUNITY)
Admission: RE | Admit: 2016-12-03 | Discharge: 2016-12-03 | Disposition: A | Discharge status: Home / Self Care | Payer: Medicaid Other | Source: Ambulatory Visit | Attending: Student | Admitting: Student

## 2016-12-03 ENCOUNTER — Encounter: Payer: Self-pay | Admitting: Student

## 2016-12-03 VITALS — BP 105/62 | HR 85 | Wt 141.3 lb

## 2016-12-03 DIAGNOSIS — Z98891 History of uterine scar from previous surgery: Secondary | ICD-10-CM | POA: Insufficient documentation

## 2016-12-03 DIAGNOSIS — Z3482 Encounter for supervision of other normal pregnancy, second trimester: Secondary | ICD-10-CM

## 2016-12-03 DIAGNOSIS — Z3A16 16 weeks gestation of pregnancy: Secondary | ICD-10-CM | POA: Insufficient documentation

## 2016-12-03 DIAGNOSIS — Z3480 Encounter for supervision of other normal pregnancy, unspecified trimester: Secondary | ICD-10-CM | POA: Insufficient documentation

## 2016-12-03 LAB — POCT URINALYSIS DIP (DEVICE)
Bilirubin Urine: NEGATIVE
Glucose, UA: NEGATIVE mg/dL
Hgb urine dipstick: NEGATIVE
Ketones, ur: NEGATIVE mg/dL
Nitrite: NEGATIVE
Protein, ur: NEGATIVE mg/dL
Specific Gravity, Urine: 1.01 (ref 1.005–1.030)
Urobilinogen, UA: 0.2 mg/dL (ref 0.0–1.0)
pH: 6 (ref 5.0–8.0)

## 2016-12-03 NOTE — Progress Notes (Signed)
  Subjective:    Casey Obrien is being seen today for her first obstetrical visit.  This is a planned pregnancy. She is at 7826w3d gestation. Her obstetrical history is significant for none. . Relationship with FOB: spouse, living together. Patient does intend to breast feed. Pregnancy history fully reviewed.  Patient reports no complaints.  Review of Systems:   Review of Systems  All other systems reviewed and are negative.   Objective:     BP 105/62   Pulse 85   Wt 141 lb 4.8 oz (64.1 kg)   LMP 08/10/2016 (Approximate)   BMI 27.03 kg/m  Physical Exam  Constitutional: She is oriented to person, place, and time. She appears well-developed.  HENT:  Head: Normocephalic.  Neck: Normal range of motion.  Respiratory: Effort normal.  GI: Soft.  Genitourinary:  Genitourinary Comments: NEFG: breast exam benign no lumps or masses.   Musculoskeletal: Normal range of motion.  Neurological: She is alert and oriented to person, place, and time.  Skin: Skin is warm and dry.    Exam    Assessment:    Pregnancy: G2P1001 Patient Active Problem List   Diagnosis Date Noted  . Supervision of other normal pregnancy, antepartum 12/03/2016  . History of C-section 12/03/2016       Plan:     Initial labs drawn. Prenatal vitamins. Problem list reviewed and updated. AFP3 discussed: declined. Role of ultrasound in pregnancy discussed; fetal survey: requested. Amniocentesis discussed: not indicated. Follow up in 4 weeks. 50% of 30 min visit spent on counseling and coordination of care.  -Patient said that she had normal pap last year, although do not see documentation in Care Everywhre. Will do pp if necessary.  -OB history Records available in Care Everywhere  Charlesetta GaribaldiKathryn Lorraine [facility] 12/03/2016

## 2016-12-03 NOTE — Patient Instructions (Signed)

## 2016-12-04 LAB — OBSTETRIC PANEL, INCLUDING HIV
Antibody Screen: NEGATIVE
BASOS ABS: 0 10*3/uL (ref 0.0–0.2)
Basos: 0 %
EOS (ABSOLUTE): 0.1 10*3/uL (ref 0.0–0.4)
Eos: 1 %
HEMOGLOBIN: 11.2 g/dL (ref 11.1–15.9)
HIV SCREEN 4TH GENERATION: NONREACTIVE
Hematocrit: 33.6 % — ABNORMAL LOW (ref 34.0–46.6)
Hepatitis B Surface Ag: NEGATIVE
Immature Grans (Abs): 0 10*3/uL (ref 0.0–0.1)
Immature Granulocytes: 0 %
LYMPHS ABS: 1.8 10*3/uL (ref 0.7–3.1)
Lymphs: 25 %
MCH: 27 pg (ref 26.6–33.0)
MCHC: 33.3 g/dL (ref 31.5–35.7)
MCV: 81 fL (ref 79–97)
MONOCYTES: 6 %
Monocytes Absolute: 0.4 10*3/uL (ref 0.1–0.9)
NEUTROS ABS: 4.9 10*3/uL (ref 1.4–7.0)
NEUTROS PCT: 68 %
PLATELETS: 175 10*3/uL (ref 150–379)
RBC: 4.15 x10E6/uL (ref 3.77–5.28)
RDW: 16.3 % — AB (ref 12.3–15.4)
RPR Ser Ql: NONREACTIVE
Rh Factor: POSITIVE
Rubella Antibodies, IGG: 7.58 index (ref >0.99)
WBC: 7.1 10*3/uL (ref 3.4–10.8)

## 2016-12-04 LAB — GC/CHLAMYDIA PROBE AMP (~~LOC~~) NOT AT ARMC
Chlamydia: NEGATIVE
Neisseria Gonorrhea: NEGATIVE

## 2016-12-05 LAB — URINE CULTURE, OB REFLEX

## 2016-12-05 LAB — CULTURE, OB URINE

## 2016-12-14 LAB — CYSTIC FIBROSIS MUTATION 97: Interpretation: NOT DETECTED

## 2016-12-16 ENCOUNTER — Inpatient Hospital Stay (HOSPITAL_COMMUNITY): Payer: Medicaid Other

## 2016-12-16 ENCOUNTER — Inpatient Hospital Stay (HOSPITAL_COMMUNITY)
Admission: AD | Admit: 2016-12-16 | Discharge: 2016-12-17 | Disposition: A | Discharge status: Home / Self Care | Payer: Medicaid Other | Source: Ambulatory Visit | Attending: Obstetrics & Gynecology | Admitting: Obstetrics & Gynecology

## 2016-12-16 DIAGNOSIS — Z833 Family history of diabetes mellitus: Secondary | ICD-10-CM | POA: Insufficient documentation

## 2016-12-16 DIAGNOSIS — R58 Hemorrhage, not elsewhere classified: Secondary | ICD-10-CM

## 2016-12-16 DIAGNOSIS — Z3A18 18 weeks gestation of pregnancy: Secondary | ICD-10-CM | POA: Diagnosis not present

## 2016-12-16 DIAGNOSIS — N939 Abnormal uterine and vaginal bleeding, unspecified: Secondary | ICD-10-CM | POA: Diagnosis present

## 2016-12-16 DIAGNOSIS — Z3480 Encounter for supervision of other normal pregnancy, unspecified trimester: Secondary | ICD-10-CM

## 2016-12-16 DIAGNOSIS — Z8249 Family history of ischemic heart disease and other diseases of the circulatory system: Secondary | ICD-10-CM | POA: Diagnosis not present

## 2016-12-16 DIAGNOSIS — O34219 Maternal care for unspecified type scar from previous cesarean delivery: Secondary | ICD-10-CM | POA: Insufficient documentation

## 2016-12-16 DIAGNOSIS — O209 Hemorrhage in early pregnancy, unspecified: Secondary | ICD-10-CM | POA: Diagnosis not present

## 2016-12-16 LAB — URINALYSIS, ROUTINE W REFLEX MICROSCOPIC
BACTERIA UA: NONE SEEN
BILIRUBIN URINE: NEGATIVE
GLUCOSE, UA: NEGATIVE mg/dL
Ketones, ur: NEGATIVE mg/dL
NITRITE: NEGATIVE
PROTEIN: NEGATIVE mg/dL
Specific Gravity, Urine: 1.029 (ref 1.005–1.030)
pH: 6 (ref 5.0–8.0)

## 2016-12-16 LAB — WET PREP, GENITAL
Clue Cells Wet Prep HPF POC: NONE SEEN
SPERM: NONE SEEN
Trich, Wet Prep: NONE SEEN
YEAST WET PREP: NONE SEEN

## 2016-12-16 NOTE — MAU Note (Signed)
Pt here with c/o vaginal bleeding. Having some cramping.

## 2016-12-17 DIAGNOSIS — O209 Hemorrhage in early pregnancy, unspecified: Secondary | ICD-10-CM | POA: Diagnosis not present

## 2016-12-17 NOTE — MAU Note (Signed)
Pt returned from US, provider reviewed results with patient and told pt to wait for D/C instructions, however pt did not wait and left with out D/C vitals and paperwork.

## 2016-12-17 NOTE — MAU Provider Note (Signed)
Patient Casey Obrien is a 26 y.o. G2P1001  At 1880w3d here with complaints of bleeding this evening. She denies other ob-gyn complaints. She gets her prenatal care at [facility];  Her ob history is significant for prior c-section.  The bleeding began this evening after she had intercourse this morning.  History     CSN: [id number]  Arrival date and time: 12/16/16 2216   None     Chief Complaint  Patient presents with  . Vaginal Bleeding   Vaginal Bleeding  The patient's primary symptoms include vaginal bleeding. The patient's pertinent negatives include no genital itching, genital lesions, genital odor or pelvic pain. This is a new problem. The current episode started today. The problem occurs intermittently. Pertinent negatives include no abdominal pain, anorexia, diarrhea or discolored urine. The vaginal discharge was normal. The vaginal bleeding is spotting. She has not been passing clots. She has not been passing tissue.  Patient had some bleeding and spotting (bright red blood and some brown blood) when she wiped this evening. Patient has not had to change her pad or wear a pantyline. She and her husband were concerned so they came to the MAU. She has had a few light cramps off and on over the past week.   OB History    Gravida Para Term Preterm AB Living   2 1 1     1    SAB TAB Ectopic Multiple Live Births           1      Past Medical History:  Diagnosis Date  . Medical history non-contributory     Past Surgical History:  Procedure Laterality Date  . APPENDECTOMY    . CESAREAN SECTION     cervix not dilating   . TONSILLECTOMY      Family History  Problem Relation Age of Onset  . Hypertension Mother   . Heart disease Father   . Diabetes Father   . Hypertension Father     Social History   Tobacco Use  . Smoking status: Never Smoker  . Smokeless tobacco: Never Used  Substance Use Topics  . Alcohol use: No  . Drug use: No    Allergies: No Known  Allergies  Medications Prior to Admission  Medication Sig Dispense Refill Last Dose  . loratadine (CLARITIN) 10 MG tablet Take 10 mg by mouth daily.   Not Taking  . metoCLOPramide (REGLAN) 10 MG tablet Take 1 tablet (10 mg total) by mouth 4 (four) times daily -  before meals and at bedtime. (Patient not taking: Reported on 12/03/2016) 30 tablet 1 Not Taking  . ranitidine (ZANTAC) 150 MG tablet Take 1 tablet (150 mg total) by mouth at bedtime. (Patient not taking: Reported on 12/03/2016) 30 tablet 1 Not Taking    Review of Systems  HENT: Negative.   Respiratory: Negative.   Gastrointestinal: Negative for abdominal pain, anorexia and diarrhea.  Genitourinary: Positive for vaginal bleeding. Negative for pelvic pain.  Neurological: Negative.   Psychiatric/Behavioral: Negative.    Physical Exam   Blood pressure 93/62, pulse 91, temperature 98 F (36.7 C), temperature source Oral, resp. rate 18, height 5' 0.63" (1.54 m), weight 144 lb (65.3 kg), last menstrual period 08/10/2016, SpO2 99 %, unknown if currently breastfeeding.  Physical Exam  Constitutional: She is oriented to person, place, and time. She appears well-developed.  HENT:  Head: Normocephalic.  Eyes: Pupils are equal, round, and reactive to light.  Respiratory: Effort normal.  GI: Soft. She exhibits no distension  and no mass. There is no tenderness. There is no rebound and no guarding.  Genitourinary:  Genitourinary Comments: NEFG; no blood in the vagina or extruding from the cervical os. No CMT, no suprapubic tendernes; cervix is closed.   Musculoskeletal: Normal range of motion.  Neurological: She is alert and oriented to person, place, and time.  Skin: Skin is warm and dry.    MAU Course  Procedures  MDM FHR at 130 bpm; patient blood type is A pos.  OB limited US shows no signs of previa or placental abruption Patient had no active bleeding during her MAU stay.   Assessment and Plan   1. Vaginal bleeding before [redacted]  weeks gestation   2. Supervision of other normal pregnancy, antepartum   3. Bleeding    2. Patient stable for discharge; reviewed US results with patient.  3. Recommend pelvic rest; keep next ob visit and US appt.  4. All questions answered; patient and husband verbalized understanding.   Charlesetta GaribaldiKathryn Lorraine Desira Alessandrini CNM 12/17/2016, 12:38 AM

## 2016-12-17 NOTE — Discharge Instructions (Signed)

## 2016-12-18 LAB — GC/CHLAMYDIA PROBE AMP (~~LOC~~) NOT AT ARMC
Chlamydia: NEGATIVE
Neisseria Gonorrhea: NEGATIVE

## 2016-12-24 ENCOUNTER — Ambulatory Visit (HOSPITAL_COMMUNITY)
Admission: RE | Admit: 2016-12-24 | Discharge: 2016-12-24 | Disposition: A | Discharge status: Home / Self Care | Payer: Medicaid Other | Source: Ambulatory Visit | Attending: Student | Admitting: Student

## 2016-12-24 ENCOUNTER — Other Ambulatory Visit: Payer: Self-pay | Admitting: Student

## 2016-12-24 ENCOUNTER — Encounter (HOSPITAL_COMMUNITY): Payer: Self-pay

## 2016-12-24 ENCOUNTER — Telehealth: Payer: Self-pay | Admitting: Student

## 2016-12-24 DIAGNOSIS — Z98891 History of uterine scar from previous surgery: Secondary | ICD-10-CM

## 2016-12-24 DIAGNOSIS — Z3480 Encounter for supervision of other normal pregnancy, unspecified trimester: Secondary | ICD-10-CM

## 2016-12-24 DIAGNOSIS — Z3A19 19 weeks gestation of pregnancy: Secondary | ICD-10-CM

## 2016-12-24 DIAGNOSIS — O34219 Maternal care for unspecified type scar from previous cesarean delivery: Secondary | ICD-10-CM | POA: Diagnosis present

## 2016-12-24 DIAGNOSIS — Z3A21 21 weeks gestation of pregnancy: Secondary | ICD-10-CM | POA: Diagnosis not present

## 2016-12-24 DIAGNOSIS — O09892 Supervision of other high risk pregnancies, second trimester: Secondary | ICD-10-CM | POA: Diagnosis not present

## 2016-12-24 DIAGNOSIS — Z363 Encounter for antenatal screening for malformations: Secondary | ICD-10-CM | POA: Diagnosis present

## 2016-12-24 NOTE — Telephone Encounter (Signed)
Called patient to ask her to come back to office because she left without getting her labs done or scheduling her next visit. Have sent text messages to mother and patient and called but are unable to leave a message. Will continue to try this afternoon.

## 2016-12-25 ENCOUNTER — Other Ambulatory Visit (HOSPITAL_COMMUNITY): Payer: Self-pay | Admitting: *Deleted

## 2016-12-25 DIAGNOSIS — Z0489 Encounter for examination and observation for other specified reasons: Secondary | ICD-10-CM

## 2016-12-25 DIAGNOSIS — IMO0002 Reserved for concepts with insufficient information to code with codable children: Secondary | ICD-10-CM

## 2016-12-31 ENCOUNTER — Ambulatory Visit (INDEPENDENT_AMBULATORY_CARE_PROVIDER_SITE_OTHER): Payer: Medicaid Other | Admitting: Student

## 2016-12-31 VITALS — BP 101/59 | HR 101 | Wt 145.8 lb

## 2016-12-31 DIAGNOSIS — Z98891 History of uterine scar from previous surgery: Secondary | ICD-10-CM

## 2016-12-31 DIAGNOSIS — N939 Abnormal uterine and vaginal bleeding, unspecified: Secondary | ICD-10-CM | POA: Insufficient documentation

## 2016-12-31 DIAGNOSIS — Z3480 Encounter for supervision of other normal pregnancy, unspecified trimester: Secondary | ICD-10-CM

## 2016-12-31 DIAGNOSIS — Z3482 Encounter for supervision of other normal pregnancy, second trimester: Secondary | ICD-10-CM

## 2016-12-31 NOTE — Progress Notes (Signed)
[employer]  Interpreter [employer] [phone]

## 2016-12-31 NOTE — Progress Notes (Signed)
   PRENATAL VISIT NOTE  Subjective:  Casey Obrien is a 26 y.o. G2P1001 at 5848w3d being seen today for ongoing prenatal care.  She is currently monitored for the following issues for this low-risk pregnancy and has Supervision of other normal pregnancy, antepartum and History of C-section on their problem list.  Patient reports occasional round ligament pain when she is getting in and out of bed. .  Contractions: Not present. Vag. Bleeding: None.  Movement: Present. Denies leaking of fluid.   The following portions of the patient's history were reviewed and updated as appropriate: allergies, current medications, past family history, past medical history, past social history, past surgical history and problem list. Problem list updated.  Objective:   Vitals:   12/31/16 1051  BP: (!) 101/59  Pulse: (!) 101  Weight: 145 lb 12.8 oz (66.1 kg)    Fetal Status: Fetal Heart Rate (bpm): 155   Movement: Present     General:  Alert, oriented and cooperative. Patient is in no acute distress.  Skin: Skin is warm and dry. No rash noted.   Cardiovascular: Normal heart rate noted  Respiratory: Normal respiratory effort, no problems with respiration noted  Abdomen: Soft, gravid, appropriate for gestational age.  Pain/Pressure: Present     Pelvic: Cervical exam deferred        Extremities: Normal range of motion.  Edema: None  Mental Status:  Normal mood and affect. Normal behavior. Normal judgment and thought content.   Assessment and Plan:  Pregnancy: G2P1001 at 1648w3d  1. Supervision of other normal pregnancy, antepartum No more vaginal bleeding since her MAU visit.   2. History of C-section Wants c/section on the 4th of April; I discussed that this may not be possible but we can evaluate later in the pregnancy.   Preterm labor symptoms and general obstetric precautions including but not limited to vaginal bleeding, contractions, leaking of fluid and fetal movement were reviewed in detail with  the patient. Please refer to After Visit Summary for other counseling recommendations.  Return in about 4 weeks (around 01/28/2017).   Casey Obrien, CNM

## 2016-12-31 NOTE — Patient Instructions (Signed)

## 2017-01-22 ENCOUNTER — Other Ambulatory Visit (HOSPITAL_COMMUNITY): Payer: Self-pay | Admitting: Maternal and Fetal Medicine

## 2017-01-22 ENCOUNTER — Ambulatory Visit (HOSPITAL_COMMUNITY)
Admission: RE | Admit: 2017-01-22 | Discharge: 2017-01-22 | Disposition: A | Discharge status: Home / Self Care | Payer: Medicaid Other | Source: Ambulatory Visit | Attending: Student | Admitting: Student

## 2017-01-22 DIAGNOSIS — Z3A25 25 weeks gestation of pregnancy: Secondary | ICD-10-CM | POA: Insufficient documentation

## 2017-01-22 DIAGNOSIS — Z362 Encounter for other antenatal screening follow-up: Secondary | ICD-10-CM | POA: Diagnosis present

## 2017-01-22 DIAGNOSIS — Z0489 Encounter for examination and observation for other specified reasons: Secondary | ICD-10-CM

## 2017-01-22 DIAGNOSIS — O34219 Maternal care for unspecified type scar from previous cesarean delivery: Secondary | ICD-10-CM

## 2017-01-22 DIAGNOSIS — IMO0002 Reserved for concepts with insufficient information to code with codable children: Secondary | ICD-10-CM

## 2017-02-08 ENCOUNTER — Emergency Department (HOSPITAL_COMMUNITY)
Admission: EM | Admit: 2017-02-08 | Discharge: 2017-02-08 | Disposition: A | Discharge status: Home / Self Care | Payer: Medicaid Other | Attending: Emergency Medicine | Admitting: Emergency Medicine

## 2017-02-08 ENCOUNTER — Encounter (HOSPITAL_COMMUNITY): Payer: Self-pay | Admitting: Emergency Medicine

## 2017-02-08 DIAGNOSIS — M545 Low back pain, unspecified: Secondary | ICD-10-CM

## 2017-02-08 DIAGNOSIS — Y999 Unspecified external cause status: Secondary | ICD-10-CM | POA: Insufficient documentation

## 2017-02-08 DIAGNOSIS — O9989 Other specified diseases and conditions complicating pregnancy, childbirth and the puerperium: Secondary | ICD-10-CM | POA: Insufficient documentation

## 2017-02-08 DIAGNOSIS — W109XXA Fall (on) (from) unspecified stairs and steps, initial encounter: Secondary | ICD-10-CM | POA: Diagnosis not present

## 2017-02-08 DIAGNOSIS — W19XXXA Unspecified fall, initial encounter: Secondary | ICD-10-CM

## 2017-02-08 DIAGNOSIS — Y929 Unspecified place or not applicable: Secondary | ICD-10-CM | POA: Diagnosis not present

## 2017-02-08 DIAGNOSIS — Y939 Activity, unspecified: Secondary | ICD-10-CM | POA: Diagnosis not present

## 2017-02-08 DIAGNOSIS — Z3A27 27 weeks gestation of pregnancy: Secondary | ICD-10-CM | POA: Insufficient documentation

## 2017-02-08 DIAGNOSIS — Z79899 Other long term (current) drug therapy: Secondary | ICD-10-CM | POA: Insufficient documentation

## 2017-02-08 DIAGNOSIS — R109 Unspecified abdominal pain: Secondary | ICD-10-CM | POA: Diagnosis present

## 2017-02-08 MED ORDER — LIDOCAINE 5 % EX PTCH: 1.0000 | MEDICATED_PATCH | CUTANEOUS | 0 refills | Status: DC

## 2017-02-08 MED ORDER — ACETAMINOPHEN 325 MG PO TABS: 650.0000 mg | ORAL_TABLET | Freq: Three times a day (TID) | ORAL | 0 refills | Status: DC

## 2017-02-08 NOTE — ED Triage Notes (Signed)
[id number]

## 2017-02-08 NOTE — Progress Notes (Signed)
Spoke with Dr. Shawnie PonsPratt. Pt is a G2P1 at [redacted] weeks gestation here because she fell and slid down the steps on her tailbone today. No vaginal bleeding or leaking of fluid. FHR tracing is a category1 with no uc's. Pt is a prior C/S. Pt is to be monitored for 30 min - one hour and then can be dc'd home as long as the FHR tracing remains a category 1 tracing and she is not contracting.

## 2017-02-08 NOTE — Progress Notes (Signed)
Spoke with Clint BolderKimberly Engle RN. W[facility] to [facility] to evaluate a pt that is [redacted] weeks gestation who has fallen and slid down the stairs on her tailbone. Says FHR is 164BPM, pt has no vaginal bleeding, and is complaining of lower back pain and lower abd pain. I am seeing a pt [facility] on [address] and cannot leave at this time. She says that the pt can wait until I can get to [facility].

## 2017-02-08 NOTE — ED Notes (Signed)
Bed: WA10 Expected date:  Expected time:  Means of arrival:  Comments: Hold triage 

## 2017-02-08 NOTE — Progress Notes (Signed)
Pt is a G2P1 at [redacted] weeks gestation here because she fell and slid down the stairs on her tailbone. Pt is complaining of lower back pain and lower abd pain. No vaginal bleeding or leaking of fluid [facility]. She is a previous C/S. Her first child is 27 year old and she plans to deliver this baby by C/S.

## 2017-02-08 NOTE — ED Triage Notes (Signed)
Casey Obrien, ob rapid response called. [facility] at approx 15:50 and available at [phone]. Fetal heart rate in triage 164.

## 2017-02-08 NOTE — Discharge Instructions (Signed)
Take Tylenol 3 times a day for pain. Use lidocaine patches as needed for pain. Use heating pads or ice packs to help with your pain. Follow-up with your OB/GYN at your appointment tomorrow and in 2 weeks for reevaluation of your back and reassessment of your baby. Return to the emergency room if you develop numbness or loss of bowel or bladder control. Go to the [facility] ([facility]) if you have vaginal bleeding, contractions, or any new or concerning symptoms.

## 2017-02-08 NOTE — ED Triage Notes (Signed)
Pt approx [redacted] weeks pregnant, EDC: 05/03/2017, pt fell down 4-5 stairs today, tailbone hitting stairs, pt c/o low back / sacral pain, pain in lower abdomen / pelvis. Pt denies bleeding, pt c/o pelvic cramping.

## 2017-02-08 NOTE — ED Provider Notes (Signed)
[facility]-EMERGENCY DEPT Provider Note   CSN: [id number] Arrival date & time: 02/08/17  1455     History   Chief Complaint Chief Complaint  Patient presents with  . Fall  . Back Pain    HPI Casey Obrien is a 27 y.o. female presenting for evaluation of back pain after a fall.  Patient states she slipped down some stairs yesterday and fell down on her back.  She had minimal pain last night, but when she woke up this morning, she had worsening pain.  Pain is midline low back.  She has some associated anterior pain.  It is constant and sharp.  She has not taken anything for her pain.  She is [redacted] weeks pregnant, G2 P1.  She denies vaginal bleeding.  She denies hitting her head or loss of consciousness.  She denies headache, neck pain, upper back pain, numbness, tingling, loss of bowel or bladder control.   HPI  Past Medical History:  Diagnosis Date  . Medical history non-contributory     Patient Active Problem List   Diagnosis Date Noted  . Vaginal bleeding 12/31/2016  . Supervision of other normal pregnancy, antepartum 12/03/2016  . History of C-section 12/03/2016    Past Surgical History:  Procedure Laterality Date  . APPENDECTOMY    . CESAREAN SECTION     cervix not dilating   . TONSILLECTOMY      OB History    Gravida Para Term Preterm AB Living   2 1 1     1    SAB TAB Ectopic Multiple Live Births           1       Home Medications    Prior to Admission medications   Medication Sig Start Date End Date Taking? Authorizing Provider  FOLIC ACID PO Take 1 mg by mouth daily.    Yes [provider]  acetaminophen (TYLENOL) 325 MG tablet Take 2 tablets (650 mg total) by mouth 3 (three) times daily. 02/08/17   Kelvyn Schunk, PA-C  lidocaine (LIDODERM) 5 % Place 1 patch onto the skin daily. Remove & Discard patch within 12 hours or as directed by MD 02/08/17   Arlee Bossard, PA-C  ranitidine (ZANTAC) 150 MG tablet Take 1 tablet (150  mg total) by mouth at bedtime. Patient not taking: Reported on 12/03/2016 10/01/16 10/01/17  Donette LarryBhambri, Melanie, CNM    Family History Family History  Problem Relation Age of Onset  . Hypertension Mother   . Heart disease Father   . Diabetes Father   . Hypertension Father     Social History Social History   Tobacco Use  . Smoking status: Never Smoker  . Smokeless tobacco: Never Used  Substance Use Topics  . Alcohol use: No  . Drug use: No     Allergies   Patient has no known allergies.   Review of Systems Review of Systems  Constitutional: Negative for chills and fever.  HENT: Negative for facial swelling.   Respiratory: Negative for cough and shortness of breath.   Cardiovascular: Negative for chest pain.  Gastrointestinal: Positive for abdominal pain.  Genitourinary: Negative for dysuria, frequency, hematuria and vaginal bleeding.  Musculoskeletal: Positive for back pain. Negative for neck pain.  Skin: Negative for rash.  Allergic/Immunologic: Negative for immunocompromised state.  Neurological: Negative for numbness and headaches.  Hematological: Does not bruise/bleed easily.     Physical Exam Updated Vital Signs BP (!) 92/56 (BP Location: Left Arm)   Pulse Marland Kitchen(!)  101   Temp 98.2 F (36.8 C) (Oral)   Resp 16   LMP 08/10/2016 (Approximate)   SpO2 97%   Physical Exam  Constitutional: She is oriented to person, place, and time. She appears well-developed and well-nourished. No distress.  HENT:  Head: Normocephalic and atraumatic.  No tenderness palpation of the head or scalp.  No obvious injury, contusion, or laceration.  Eyes: Conjunctivae and EOM are normal. Pupils are equal, round, and reactive to light.  Neck: Normal range of motion. Neck supple.  No tenderness palpation midline C-spine.  Full active range of motion of head and neck without pain.  Cardiovascular: Normal rate, regular rhythm and intact distal pulses.  Pulmonary/Chest: Effort normal and breath  sounds normal. No respiratory distress. She has no wheezes.  Abdominal: Soft. She exhibits no distension and no mass. There is no tenderness. There is no guarding.  No tenderness palpation of the abdomen.  Musculoskeletal: She exhibits tenderness.  Tenderness palpation of midline lumbosacral back.  No tenderness palpation of bilateral back musculature.  Full active range of motion of all activities without difficulty.  Patient ambulatory.  Sensation intact x4.  Strength equal x4.  Neurological: She is alert and oriented to person, place, and time.  Skin: Skin is warm and dry.  Psychiatric: She has a normal mood and affect.  Nursing note and vitals reviewed.    ED Treatments / Results  Labs (all labs ordered are listed, but only abnormal results are displayed) Labs Reviewed - No data to display  EKG  EKG Interpretation None       Radiology No results found.  Procedures Procedures (including critical care time)  Medications Ordered in ED Medications - No data to display   Initial Impression / Assessment and Plan / ED Course  I have reviewed the triage vital signs and the nursing notes.  Pertinent labs & imaging results that were available during my care of the patient were reviewed by me and considered in my medical decision making (see chart for details).     Pt presenting for evaluation after a fall.  Physical exam shows she has tenderness palpation of midline low back.  Vital signs reassuring.  No obvious neurologic deficits.  Patient is ambulatory.  Rapid OB called, fetal heart tones reassuring.  No contractions.  Patient cleared from an OB standpoint.  Discussed case with attending, Dr. Rubin Payor agrees to plan.  As patient is pregnant, will not obtain x-rays or imaging.  Will treat with Tylenol, heat, and ice.  Patient to follow-up with OB/GYN as needed.  At this time, patient appears safe for discharge.  Return precautions given.  Patient states she understands and  agrees to plan.   Final Clinical Impressions(s) / ED Diagnoses   Final diagnoses:  Fall, initial encounter  Acute midline low back pain without sciatica    ED Discharge Orders        Ordered    acetaminophen (TYLENOL) 325 MG tablet  3 times daily     02/08/17 1730    lidocaine (LIDODERM) 5 %  Every 24 hours     02/08/17 1730       Emilygrace Grothe, PA-C 02/09/17 0127    Benjiman Core, MD 02/09/17 1454

## 2017-02-09 ENCOUNTER — Other Ambulatory Visit: Payer: Medicaid Other

## 2017-02-09 DIAGNOSIS — Z3480 Encounter for supervision of other normal pregnancy, unspecified trimester: Secondary | ICD-10-CM

## 2017-02-10 LAB — GLUCOSE TOLERANCE, 2 HOURS W/ 1HR
GLUCOSE, 2 HOUR: 82 mg/dL (ref 65–152)
Glucose, 1 hour: 106 mg/dL (ref 65–179)
Glucose, Fasting: 85 mg/dL (ref 65–91)

## 2017-02-23 ENCOUNTER — Ambulatory Visit (INDEPENDENT_AMBULATORY_CARE_PROVIDER_SITE_OTHER): Payer: Medicaid Other | Admitting: Student

## 2017-02-23 VITALS — BP 98/60 | HR 101 | Wt 144.6 lb

## 2017-02-23 DIAGNOSIS — Z3480 Encounter for supervision of other normal pregnancy, unspecified trimester: Secondary | ICD-10-CM

## 2017-02-23 DIAGNOSIS — O26893 Other specified pregnancy related conditions, third trimester: Secondary | ICD-10-CM

## 2017-02-23 DIAGNOSIS — R12 Heartburn: Secondary | ICD-10-CM

## 2017-02-23 DIAGNOSIS — Z789 Other specified health status: Secondary | ICD-10-CM | POA: Insufficient documentation

## 2017-02-23 MED ORDER — RANITIDINE HCL 150 MG PO TABS: 150.0000 mg | ORAL_TABLET | Freq: Every day | ORAL | 1 refills | Status: DC

## 2017-02-23 NOTE — Progress Notes (Signed)
   PRENATAL VISIT NOTE  Subjective:  Casey Obrien is a 27 y.o. G2P1001 at 3234w1d being seen today for ongoing prenatal care.  She is currently monitored for the following issues for this low-risk pregnancy and has Supervision of other normal pregnancy, antepartum; History of C-section; Vaginal bleeding; and Language barrier on their problem list.  Patient reports backache. States she fell on her bottom 2 weeks ago & reports sacral pain since then. Pain worse with sitting down & lying on her back. Has not treated symptoms. No abdominal pain or vaginal bleeding. Denies saddle numbness, paraesthesias, gait problems, or urinary/bowel incontinence. Contractions: Not present. Vag. Bleeding: None.  Movement: Present. Denies leaking of fluid.   The following portions of the patient's history were reviewed and updated as appropriate: allergies, current medications, past family history, past medical history, past social history, past surgical history and problem list. Problem list updated.  Objective:   Vitals:   02/23/17 1013  BP: 98/60  Pulse: (!) 101  Weight: 144 lb 9.6 oz (65.6 kg)    Fetal Status: Fetal Heart Rate (bpm): 142 Fundal Height: 31 cm Movement: Present     General:  Alert, oriented and cooperative. Patient is in no acute distress.  Skin: Skin is warm and dry. No rash noted.   Cardiovascular: Normal heart rate noted  Respiratory: Normal respiratory effort, no problems with respiration noted  Abdomen: Soft, gravid, appropriate for gestational age.  Pain/Pressure: Absent     Pelvic: Cervical exam deferred        Extremities: Normal range of motion.  Edema: None  Musculoskeletal  Low back TTP bilateral. No bruising, swelling, or deformity  Mental Status:  Normal mood and affect. Normal behavior. Normal judgment and thought content.   Assessment and Plan:  Pregnancy: G2P1001 at 6334w1d  1. Supervision of other normal pregnancy, antepartum -doing well -request records from Dr. Christy GentlesNamak's  office for pap smear & ?colposcopy last year  2. Language barrier -Arabic video interpreter used  3. Heartburn during pregnancy in third trimester  - ranitidine (ZANTAC) 150 MG tablet; Take 1 tablet (150 mg total) by mouth at bedtime.  Dispense: 30 tablet; Refill: 1  Preterm labor symptoms and general obstetric precautions including but not limited to vaginal bleeding, contractions, leaking of fluid and fetal movement were reviewed in detail with the patient. Please refer to After Visit Summary for other counseling recommendations.  Return in about 2 weeks (around 03/09/2017) for Routine OB.   Judeth HornErin Imanol Bihl, NP

## 2017-02-23 NOTE — Patient Instructions (Addendum)
Safe Medications in Pregnancy   Acne: Benzoyl Peroxide Salicylic Acid  Backache/Headache: Tylenol: 2 regular strength every 4 hours OR              2 Extra strength every 6 hours  Colds/Coughs/Allergies: Benadryl (alcohol free) 25 mg every 6 hours as needed Breath right strips Claritin Cepacol throat lozenges Chloraseptic throat spray Cold-Eeze- up to three times per day Cough drops, alcohol free Flonase (by prescription only) Guaifenesin Mucinex Robitussin DM (plain only, alcohol free) Saline nasal spray/drops Sudafed (pseudoephedrine) & Actifed ** use only after [redacted] weeks gestation and if you do not have high blood pressure Tylenol Vicks Vaporub Zinc lozenges Zyrtec   Constipation: Colace Ducolax suppositories Fleet enema Glycerin suppositories Metamucil Milk of magnesia Miralax Senokot Smooth move tea  Diarrhea: Kaopectate Imodium A-D  *NO pepto Bismol  Hemorrhoids: Anusol Anusol HC Preparation H Tucks  Indigestion: Tums Maalox Mylanta Zantac  Pepcid  Insomnia: Benadryl (alcohol free) 25mg  every 6 hours as needed Tylenol PM Unisom, no Gelcaps  Leg Cramps: Tums MagGel  Nausea/Vomiting:  Bonine Dramamine Emetrol Ginger extract Sea bands Meclizine  Nausea medication to take during pregnancy:  Unisom (doxylamine succinate 25 mg tablets) Take one tablet daily at bedtime. If symptoms are not adequately controlled, the dose can be increased to a maximum recommended dose of two tablets daily (1/2 tablet in the morning, 1/2 tablet mid-afternoon and one at bedtime). Vitamin B6 100mg  tablets. Take one tablet twice a day (up to 200 mg per day).  Skin Rashes: Aveeno products Benadryl cream or 25mg  every 6 hours as needed Calamine Lotion 1% cortisone cream  Yeast infection: Gyne-lotrimin 7 Monistat 7  Gum/tooth pain: Anbesol  **If taking multiple medications, please check labels to avoid duplicating the same active ingredients **take  medication as directed on the label ** Do not exceed 4000 mg of tylenol in 24 hours **Do not take medications that contain aspirin or ibuprofen         Third Trimester of Pregnancy The third trimester is from week 29 through week 42, months 7 through 9. This trimester is when your unborn baby (fetus) is growing very fast. At the end of the ninth month, the unborn baby is about 20 inches in length. It weighs about 6-10 pounds. Follow these instructions at home:  Avoid all smoking, herbs, and alcohol. Avoid drugs not approved by your doctor.  Do not use any tobacco products, including cigarettes, chewing tobacco, and electronic cigarettes. If you need help quitting, ask your doctor. You may get counseling or other support to help you quit.  Only take medicine as told by your doctor. Some medicines are safe and some are not during pregnancy.  Exercise only as told by your doctor. Stop exercising if you start having cramps.  Eat regular, healthy meals.  Wear a good support bra if your breasts are tender.  Do not use hot tubs, steam rooms, or saunas.  Wear your seat belt when driving.  Avoid raw meat, uncooked cheese, and liter boxes and soil used by cats.  Take your prenatal vitamins.  Take 1500-2000 milligrams of calcium daily starting at the 20th week of pregnancy until you deliver your baby.  Try taking medicine that helps you poop (stool softener) as needed, and if your doctor approves. Eat more fiber by eating fresh fruit, vegetables, and whole grains. Drink enough fluids to keep your pee (urine) clear or pale yellow.  Take warm water baths (sitz baths) to soothe pain or discomfort caused  by hemorrhoids. Use hemorrhoid cream if your doctor approves.  If you have puffy, bulging veins (varicose veins), wear support hose. Raise (elevate) your feet for 15 minutes, 3-4 times a day. Limit salt in your diet.  Avoid heavy lifting, wear low heels, and sit up straight.  Rest with  your legs raised if you have leg cramps or low back pain.  Visit your dentist if you have not gone during your pregnancy. Use a soft toothbrush to brush your teeth. Be gentle when you floss.  You can have sex (intercourse) unless your doctor tells you not to.  Do not travel far distances unless you must. Only do so with your doctor's approval.  Take prenatal classes.  Practice driving to the hospital.  Pack your hospital bag.  Prepare the baby's room.  Go to your doctor visits. Get help if:  You are not sure if you are in labor or if your water has broken.  You are dizzy.  You have mild cramps or pressure in your lower belly (abdominal).  You have a nagging pain in your belly area.  You continue to feel sick to your stomach (nauseous), throw up (vomit), or have watery poop (diarrhea).  You have bad smelling fluid coming from your vagina.  You have pain with peeing (urination). Get help right away if:  You have a fever.  You are leaking fluid from your vagina.  You are spotting or bleeding from your vagina.  You have severe belly cramping or pain.  You lose or gain weight rapidly.  You have trouble catching your breath and have chest pain.  You notice sudden or extreme puffiness (swelling) of your face, hands, ankles, feet, or legs.  You have not felt the baby move in over an hour.  You have severe headaches that do not go away with medicine.  You have vision changes. This information is not intended to replace advice given to you by your health care provider. Make sure you discuss any questions you have with your health care provider. Document Released: 04/08/2009 Document Revised: 06/20/2015 Document Reviewed: 03/15/2012 Elsevier Interactive Patient Education  2017 [employer].

## 2017-02-23 NOTE — Progress Notes (Signed)
[employer] interpreter Noha [phone]

## 2017-03-10 ENCOUNTER — Ambulatory Visit (INDEPENDENT_AMBULATORY_CARE_PROVIDER_SITE_OTHER): Payer: Medicaid Other | Admitting: Advanced Practice Midwife

## 2017-03-10 VITALS — BP 115/65 | HR 108 | Temp 98.5°F | Wt 146.2 lb

## 2017-03-10 DIAGNOSIS — Z98891 History of uterine scar from previous surgery: Secondary | ICD-10-CM

## 2017-03-10 DIAGNOSIS — Z3480 Encounter for supervision of other normal pregnancy, unspecified trimester: Secondary | ICD-10-CM

## 2017-03-10 DIAGNOSIS — Z603 Acculturation difficulty: Secondary | ICD-10-CM

## 2017-03-10 DIAGNOSIS — Z789 Other specified health status: Secondary | ICD-10-CM

## 2017-03-10 NOTE — Progress Notes (Signed)
   PRENATAL VISIT NOTE  Subjective:  Casey Obrien is a 27 y.o. G2P1001 at 2339w2d being seen today for ongoing prenatal care.  She is currently monitored for the following issues for this low-risk pregnancy and has Supervision of other normal pregnancy, antepartum; History of C-section; Vaginal bleeding; and Language barrier on their problem list.  Patient reports longering cough x 20 days, but feeling much better. .  Contractions: Not present. Vag. Bleeding: None.  Movement: Present. Denies leaking of fluid.   The following portions of the patient's history were reviewed and updated as appropriate: allergies, current medications, past family history, past medical history, past social history, past surgical history and problem list. Problem list updated.  Objective:   Vitals:   03/10/17 1133  BP: 115/65  Pulse: (!) 108  Temp: 98.5 F (36.9 C)  Weight: 146 lb 3.2 oz (66.3 kg)    Fetal Status: Fetal Heart Rate (bpm): 126 Fundal Height: 32 cm Movement: Present     General:  Alert, oriented and cooperative. Patient is in no acute distress.  Skin: Skin is warm and dry. No rash noted.   Cardiovascular: Normal heart rate noted  Respiratory: Normal respiratory effort, no problems with respiration noted  Abdomen: Soft, gravid, appropriate for gestational age.  Pain/Pressure: Absent     Pelvic: Cervical exam deferred        Extremities: Normal range of motion.  Edema: None  Mental Status:  Normal mood and affect. Normal behavior. Normal judgment and thought content.   Assessment and Plan:  Pregnancy: G2P1001 at 4039w2d  1. Supervision of other normal pregnancy, antepartum   2. Language barrier - Live interpreter used Feryal Yousef  3. History of C-section - Request for repeat C/S sent. Prefers 04/29/17. Wants Dr. Despina HiddenEure if possible.  - Doe snot want BTL. Discussed LARC.   Preterm labor symptoms and general obstetric precautions including but not limited to vaginal bleeding, contractions,  leaking of fluid and fetal movement were reviewed in detail with the patient. Please refer to After Visit Summary for other counseling recommendations.  F/U 2 weeks.   Dorathy KinsmanVirginia Chaye Misch, CNM

## 2017-03-11 ENCOUNTER — Encounter (HOSPITAL_COMMUNITY): Payer: Self-pay

## 2017-03-23 ENCOUNTER — Ambulatory Visit (INDEPENDENT_AMBULATORY_CARE_PROVIDER_SITE_OTHER): Payer: Medicaid Other | Admitting: Family Medicine

## 2017-03-23 ENCOUNTER — Encounter: Payer: Medicaid Other | Admitting: Family Medicine

## 2017-03-23 ENCOUNTER — Other Ambulatory Visit: Payer: Self-pay

## 2017-03-23 DIAGNOSIS — Z3480 Encounter for supervision of other normal pregnancy, unspecified trimester: Secondary | ICD-10-CM

## 2017-03-23 DIAGNOSIS — Z3483 Encounter for supervision of other normal pregnancy, third trimester: Secondary | ICD-10-CM | POA: Diagnosis not present

## 2017-03-23 DIAGNOSIS — Z23 Encounter for immunization: Secondary | ICD-10-CM | POA: Diagnosis not present

## 2017-03-23 DIAGNOSIS — O26893 Other specified pregnancy related conditions, third trimester: Secondary | ICD-10-CM | POA: Diagnosis not present

## 2017-03-23 DIAGNOSIS — G5600 Carpal tunnel syndrome, unspecified upper limb: Secondary | ICD-10-CM | POA: Diagnosis not present

## 2017-03-23 DIAGNOSIS — O26899 Other specified pregnancy related conditions, unspecified trimester: Secondary | ICD-10-CM

## 2017-03-23 NOTE — Progress Notes (Signed)
   PRENATAL VISIT NOTE  Subjective:  Casey Obrien is a 27 y.o. G2P1001 at 4249w1d being seen today for ongoing prenatal care.  She is currently monitored for the following issues for this low-risk pregnancy and has Supervision of other normal pregnancy, antepartum; History of C-section; Vaginal bleeding; Language barrier; and Carpal tunnel syndrome during pregnancy on their problem list.  Patient reports bilateral hand swelling and tingling when she wakes in the morning. She notices she feels the same numbness and tingling when she feeds her 27 year old with a spoon.  Contractions: Not present. Vag. Bleeding: None.  Movement: Present. Denies leaking of fluid.   The following portions of the patient's history were reviewed and updated as appropriate: allergies, current medications, past family history, past medical history, past social history, past surgical history and problem list. Problem list updated.  Objective:   Vitals:   03/23/17 1324  BP: 111/60  Pulse: (!) 115  Weight: 68.8 kg (151 lb 9.6 oz)    Fetal Status: Fetal Heart Rate (bpm): 147 Fundal Height: 35 cm Movement: Present     General:  Alert, oriented and cooperative. Patient is in no acute distress.  Skin: Skin is warm and dry. No rash noted.   Cardiovascular: Normal heart rate noted  Respiratory: Normal respiratory effort, no problems with respiration noted  Abdomen: Soft, gravid, appropriate for gestational age.  Pain/Pressure: Absent     Pelvic: Cervical exam deferred        Extremities: Normal range of motion.  No swelling in bilateral upper hands, good ROM, no tenderness to palpation, sensation intact bilateral hands  Mental Status:  Normal mood and affect. Normal behavior. Normal judgment and thought content.   Assessment and Plan:  Pregnancy: G2P1001 at 7749w1d  1. Supervision of other normal pregnancy, antepartum Follow up in 1 week for GBS Patient requests Dr. Despina HiddenEure for surgery because he was highly recommended by a  family member. I explained that her c-section is already scheduled, and it is not with Dr. Despina HiddenEure - Tdap vaccine greater than or equal to 7yo IM - CBC - HIV antibody - RPR  2. Carpal tunnel syndrome during pregnancy Advised night splinting if symptoms are bothersome. Otherwise, symptoms will resolve within a few weeks after delivery.   Preterm labor symptoms and general obstetric precautions including but not limited to vaginal bleeding, contractions, leaking of fluid and fetal movement were reviewed in detail with the patient. Please refer to After Visit Summary for other counseling recommendations.  Return in about 1 week (around 03/30/2017).   Rolm BookbinderAmber Sharan Mcenaney, DO

## 2017-03-23 NOTE — Progress Notes (Signed)
Pt states is having a lot of numbness in hands & legs especially when she wakes up.  Tdap accepted on 03/23/17

## 2017-03-23 NOTE — Patient Instructions (Signed)
Braxton Hicks Contractions °Contractions of the uterus can occur throughout pregnancy, but they are not always a sign that you are in labor. You may have practice contractions called Braxton Hicks contractions. These false labor contractions are sometimes confused with true labor. °What are Braxton Hicks contractions? °Braxton Hicks contractions are tightening movements that occur in the muscles of the uterus before labor. Unlike true labor contractions, these contractions do not result in opening (dilation) and thinning of the cervix. Toward the end of pregnancy (32-34 weeks), Braxton Hicks contractions can happen more often and may become stronger. These contractions are sometimes difficult to tell apart from true labor because they can be very uncomfortable. You should not feel embarrassed if you go to the hospital with false labor. °Sometimes, the only way to tell if you are in true labor is for your health care provider to look for changes in the cervix. The health care provider will do a physical exam and may monitor your contractions. If you are not in true labor, the exam should show that your cervix is not dilating and your water has not broken. °If there are other health problems associated with your pregnancy, it is completely safe for you to be sent home with false labor. You may continue to have Braxton Hicks contractions until you go into true labor. °How to tell the difference between true labor and false labor °True labor °· Contractions last 30-70 seconds. °· Contractions become very regular. °· Discomfort is usually felt in the top of the uterus, and it spreads to the lower abdomen and low back. °· Contractions do not go away with walking. °· Contractions usually become more intense and increase in frequency. °· The cervix dilates and gets thinner. °False labor °· Contractions are usually shorter and not as strong as true labor contractions. °· Contractions are usually irregular. °· Contractions  are often felt in the front of the lower abdomen and in the groin. °· Contractions may go away when you walk around or change positions while lying down. °· Contractions get weaker and are shorter-lasting as time goes on. °· The cervix usually does not dilate or become thin. °Follow these instructions at home: °· Take over-the-counter and prescription medicines only as told by your health care provider. °· Keep up with your usual exercises and follow other instructions from your health care provider. °· Eat and drink lightly if you think you are going into labor. °· If Braxton Hicks contractions are making you uncomfortable: °? Change your position from lying down or resting to walking, or change from walking to resting. °? Sit and rest in a tub of warm water. °? Drink enough fluid to keep your urine pale yellow. Dehydration may cause these contractions. °? Do slow and deep breathing several times an hour. °· Keep all follow-up prenatal visits as told by your health care provider. This is important. °Contact a health care provider if: °· You have a fever. °· You have continuous pain in your abdomen. °Get help right away if: °· Your contractions become stronger, more regular, and closer together. °· You have fluid leaking or gushing from your vagina. °· You pass blood-tinged mucus (bloody show). °· You have bleeding from your vagina. °· You have low back pain that you never had before. °· You feel your baby’s head pushing down and causing pelvic pressure. °· Your baby is not moving inside you as much as it used to. °Summary °· Contractions that occur before labor are called Braxton   Hicks contractions, false labor, or practice contractions. °· Braxton Hicks contractions are usually shorter, weaker, farther apart, and less regular than true labor contractions. True labor contractions usually become progressively stronger and regular and they become more frequent. °· Manage discomfort from Braxton Hicks contractions by  changing position, resting in a warm bath, drinking plenty of water, or practicing deep breathing. °This information is not intended to replace advice given to you by your health care provider. Make sure you discuss any questions you have with your health care provider. °Document Released: 05/28/2016 Document Revised: 05/28/2016 Document Reviewed: 05/28/2016 °Elsevier Interactive Patient Education © 2018 Elsevier Inc. ° °

## 2017-03-24 LAB — CBC
HEMATOCRIT: 27.4 % — AB (ref 34.0–46.6)
HEMOGLOBIN: 8.9 g/dL — AB (ref 11.1–15.9)
MCH: 25 pg — ABNORMAL LOW (ref 26.6–33.0)
MCHC: 32.5 g/dL (ref 31.5–35.7)
MCV: 77 fL — AB (ref 79–97)
Platelets: 197 10*3/uL (ref 150–379)
RBC: 3.56 x10E6/uL — AB (ref 3.77–5.28)
RDW: 15 % (ref 12.3–15.4)
WBC: 10.3 10*3/uL (ref 3.4–10.8)

## 2017-03-24 LAB — RPR: RPR Ser Ql: NONREACTIVE

## 2017-03-24 LAB — HIV ANTIBODY (ROUTINE TESTING W REFLEX): HIV Screen 4th Generation wRfx: NONREACTIVE

## 2017-04-01 ENCOUNTER — Ambulatory Visit (INDEPENDENT_AMBULATORY_CARE_PROVIDER_SITE_OTHER): Payer: Medicaid Other | Admitting: Student

## 2017-04-01 ENCOUNTER — Other Ambulatory Visit (HOSPITAL_COMMUNITY)
Admission: RE | Admit: 2017-04-01 | Discharge: 2017-04-01 | Disposition: A | Discharge status: Home / Self Care | Payer: Medicaid Other | Source: Ambulatory Visit | Attending: Family Medicine | Admitting: Family Medicine

## 2017-04-01 VITALS — BP 106/66 | HR 131 | Wt 150.0 lb

## 2017-04-01 DIAGNOSIS — Z3483 Encounter for supervision of other normal pregnancy, third trimester: Secondary | ICD-10-CM | POA: Diagnosis not present

## 2017-04-01 DIAGNOSIS — Z3480 Encounter for supervision of other normal pregnancy, unspecified trimester: Secondary | ICD-10-CM | POA: Diagnosis present

## 2017-04-01 DIAGNOSIS — Z3A35 35 weeks gestation of pregnancy: Secondary | ICD-10-CM | POA: Diagnosis not present

## 2017-04-01 DIAGNOSIS — Z98891 History of uterine scar from previous surgery: Secondary | ICD-10-CM

## 2017-04-01 NOTE — Patient Instructions (Signed)

## 2017-04-01 NOTE — Progress Notes (Signed)
   PRENATAL VISIT NOTE  Subjective:  Casey Obrien is a 27 y.o. G2P1001 at Obrien being seen today for ongoing prenatal care.  Casey is currently monitored for the following issues for this low-risk pregnancy and has Supervision of other normal pregnancy, antepartum; History of C-section; Vaginal bleeding; Language barrier; and Carpal tunnel syndrome during pregnancy on their problem list.  Patient reports carpal tunnel symptoms.  Contractions: Not present. Vag. Bleeding: None.  Movement: Present. Denies leaking of fluid.   The following portions of the patient's history were reviewed and updated as appropriate: allergies, current medications, past family history, past medical history, past social history, past surgical history and problem list. Problem list updated.  Objective:   Vitals:   04/01/17 1105  BP: 106/66  Pulse: (!) 131  Weight: 150 lb (68 kg)    Fetal Status: Fetal Heart Rate (bpm): 148 Fundal Height: 35 cm Movement: Present  Presentation: Vertex  General:  Alert, oriented and cooperative. Patient is in no acute distress.  Skin: Skin is warm and dry. No rash noted.   Cardiovascular: Normal heart rate noted  Respiratory: Normal respiratory effort, no problems with respiration noted  Abdomen: Soft, gravid, appropriate for gestational age.  Pain/Pressure: Absent     Pelvic: Cervical exam performed Dilation: 1      Extremities: Normal range of motion.  Edema: None  Mental Status:  Normal mood and affect. Normal behavior. Normal judgment and thought content.   Assessment and Plan:  Pregnancy: G2P1001 at 2773w3d  1. Supervision of other normal pregnancy, antepartum -Reviewed warning signs of labor; will do cultures today out of caution in case vaginal delivery occurs - GC[facility]Chlamydia probe amp ()[facility] - Culture, beta strep (group b only)  2. History of C-section C-section planned for 04-29-2017  Preterm labor symptoms and general obstetric precautions including  but not limited to vaginal bleeding, contractions, leaking of fluid and fetal movement were reviewed in detail with the patient. Please refer to After Visit Summary for other counseling recommendations.  Return in about 1 week (around 04/08/2017), or LROB.   Marylene LandKathryn Lorraine Kooistra, CNM

## 2017-04-03 LAB — GC/CHLAMYDIA PROBE AMP (~~LOC~~) NOT AT ARMC
Chlamydia: NEGATIVE
Neisseria Gonorrhea: NEGATIVE

## 2017-04-05 LAB — CULTURE, BETA STREP (GROUP B ONLY): STREP GP B CULTURE: NEGATIVE

## 2017-04-08 ENCOUNTER — Ambulatory Visit (INDEPENDENT_AMBULATORY_CARE_PROVIDER_SITE_OTHER): Payer: Medicaid Other | Admitting: Student

## 2017-04-08 VITALS — BP 99/59 | HR 105 | Wt 151.3 lb

## 2017-04-08 DIAGNOSIS — K5900 Constipation, unspecified: Secondary | ICD-10-CM | POA: Insufficient documentation

## 2017-04-08 DIAGNOSIS — Z3483 Encounter for supervision of other normal pregnancy, third trimester: Secondary | ICD-10-CM

## 2017-04-08 DIAGNOSIS — Z3480 Encounter for supervision of other normal pregnancy, unspecified trimester: Secondary | ICD-10-CM

## 2017-04-08 MED ORDER — DOCUSATE SODIUM 100 MG PO TABS: 100.0000 mg | ORAL_TABLET | Freq: Three times a day (TID) | ORAL | 0 refills | Status: DC

## 2017-04-08 NOTE — Progress Notes (Signed)
Patient ID: Casey Obrien, female   DOB: 11/21/1990, 27 y.o.   MRN: [id number]   PRENATAL VISIT NOTE  Subjective:  Casey DustSeba Kunka is a 27 y.o. G2P1001 at 6168w3d being seen today for ongoing prenatal care.  She is currently monitored for the following issues for this low-risk pregnancy and has Supervision of other normal pregnancy, antepartum; History of C-section; Vaginal bleeding; Language barrier; Carpal tunnel syndrome during pregnancy; and Constipation on their problem list.  Patient reports constipation and pain when she defecates. .  Contractions: Not present. Vag. Bleeding: None.  Movement: Present. Denies leaking of fluid.   The following portions of the patient's history were reviewed and updated as appropriate: allergies, current medications, past family history, past medical history, past social history, past surgical history and problem list. Problem list updated.  Objective:   Vitals:   04/08/17 1134  BP: (!) 99/59  Pulse: (!) 105  Weight: 151 lb 4.8 oz (68.6 kg)    Fetal Status: Fetal Heart Rate (bpm): 139 Fundal Height: 38 cm Movement: Present     General:  Alert, oriented and cooperative. Patient is in no acute distress.  Skin: Skin is warm and dry. No rash noted.   Cardiovascular: Normal heart rate noted  Respiratory: Normal respiratory effort, no problems with respiration noted  Abdomen: Soft, gravid, appropriate for gestational age.  Pain/Pressure: Absent     Pelvic: Cervical exam deferred        Extremities: Normal range of motion.  Edema: None  Mental Status:  Normal mood and affect. Normal behavior. Normal judgment and thought content.   Assessment and Plan:  Pregnancy: G2P1001 at 5068w3d  1. Constipation, unspecified constipation type -Recommended colace, miralax and fiber to help with constipation. Emphasized importance of increasing H20 and keeping up her anti-constipation regimen before she has baby, as it will be harder to solve constipation issues after surgery.  Patient verbalized understanding.   2. Supervision of other normal pregnancy, antepartum -Doing well, no other complaints. Feels that baby is very active.   Term labor symptoms and general obstetric precautions including but not limited to vaginal bleeding, contractions, leaking of fluid and fetal movement were reviewed in detail with the patient. Please refer to After Visit Summary for other counseling recommendations.  Return in about 1 week (around 04/15/2017).   Marylene LandKathryn Lorraine Jese Comella, CNM

## 2017-04-08 NOTE — Progress Notes (Signed)
[employer] interpreter [employer]

## 2017-04-08 NOTE — Patient Instructions (Addendum)
-  Docusate sodium three times a day (100 mg three times a day). This is a Prescription at your pharmacy.   -Fiber capsule (any brand or kind)  as directed over the counter   -Miralax (liquid or powder) every day  As directed over the counter

## 2017-04-14 NOTE — Pre-Procedure Instructions (Signed)
Interpreter number [phone]

## 2017-04-15 ENCOUNTER — Ambulatory Visit (INDEPENDENT_AMBULATORY_CARE_PROVIDER_SITE_OTHER): Payer: Medicaid Other | Admitting: Student

## 2017-04-15 VITALS — BP 95/66 | HR 115

## 2017-04-15 DIAGNOSIS — N939 Abnormal uterine and vaginal bleeding, unspecified: Secondary | ICD-10-CM

## 2017-04-15 DIAGNOSIS — Z3480 Encounter for supervision of other normal pregnancy, unspecified trimester: Secondary | ICD-10-CM

## 2017-04-15 NOTE — Patient Instructions (Signed)
Breastfeeding and Mastitis  Mastitis is inflammation of the breast tissue. It can occur in women who are breastfeeding. This can make breastfeeding painful. Mastitis will sometimes go away on its own, especially if it is not caused by an infection (non-infectious mastitis). Your health care provider will help determine if medical treatment is needed. Treatment may be needed if the condition is caused by a bacterial infection (infectious mastitis).  What are the causes?  This condition is often associated with a blocked milkduct, which can happen when too much milk builds up in the breast. Causes of excess milk in the breast can include:  · Poor latch-on. If your baby is not latched onto the breast properly, he or she may not empty your breast completely while breastfeeding.  · Allowing too much time to pass between feedings.  · Wearing a bra or other clothing that is too tight. This puts extra pressure on the milk ducts so milk does not flow through them as it should.  · Milk remaining in the breast because it is overfilled (engorged).  · Stress and fatigue.    Mastitis can also be caused by a bacterial infection. Bacteria may enter the breast tissue through cuts, cracks, or openings in the skin near the nipple area. Cracks in the skin are often caused when your baby does not latch on properly to the breast.  What are the signs or symptoms?  Symptoms of this condition include:  · Swelling, redness, tenderness, and pain in an area of the breast. This usually affects the upper part of the breast, toward the armpit region. In most cases, it affects only one breast. In some cases, it may occur on both breasts at the same time and affect a larger portion of breast tissue.  · Swelling of the glands under the arm on the same side.  · Fatigue, headache, and flu-like muscle aches.  · Fever.  · Rapid pulse.    Symptoms usually last 2 to 5 days. Breast pain and redness are at their worst on day 2 and day 3, and they usually go  away by day 5. If an infection is left to progress, a collection of pus (abscess) may develop.  How is this diagnosed?  This condition can be diagnosed based on your symptoms and a physical exam. You may also have tests, such as:  · Blood tests to determine if your body is fighting a bacterial infection.  · Mammogram or ultrasound tests to rule out other problems or diseases.  · Fluid tests. If an abscess has developed, the fluid in the abscess may be removed with a needle. The fluid may be analyzed to determine if bacteria are present.  · Breast milk may be cultured and tested for bacteria.    How is this treated?  This condition will sometimes go away on its own. Your health care provider may choose to wait 24 hours after first seeing you to decide whether treatment is needed. If treatment is needed, it may include:  · Strategies to manage breastfeeding. This includes continuing to breastfeed or pump in order to allow adequate milk flow, using breast massage, and applying heat or cold to the affected area.  · Self-care such as rest and increased fluid intake.  · Medicine for pain.  · Antibiotic medicine to treat a bacterial infection. This is usually taken by mouth.  · If an abscess has developed, it may be treated by removing fluid with a needle.      Follow these instructions at home:  Medicines  · Take over-the-counter and prescription medicines only as told by your health care provider.  · If you were prescribed an antibiotic medicine, take it as told by your health care provider. Do not stop taking the antibiotic even if you start to feel better.  General instructions  · Do not wear a tight or underwire bra. Wear a soft, supportive bra.  · Increase your fluid intake, especially if you have a fever.  · Get plenty of rest.  For breastfeeding:  · Continue to empty your breasts as often as possible, either by breastfeeding or using an electric breast pump. This will lower the pressure and the pain that comes with  it. Ask your health care provider if changes need to be made to your breastfeeding or pumping routine.  · Keep your nipples clean and dry.  · During breastfeeding, empty the first breast completely before going to the other breast. If your baby is not emptying your breasts completely, use a breast pump to empty your breasts.  · Use breast massage during feeding or pumping sessions.  · If directed, apply moist heat to the affected area of your breast right before breastfeeding or pumping. Use the heat source that your health care provider recommends.  · If directed, put ice on the affected area of your breast right after breastfeeding or pumping:  ? Put ice in a plastic bag.  ? Place a towel between your skin and the bag.  ? Leave the ice on for 20 minutes.  · If you go back to work, pump your breasts while at work to stay in time with your nursing schedule.  · Do not allow your breasts to become engorged.  Contact a health care provider if:  · You have pus-like discharge from the breast.  · You have a fever.  · Your symptoms do not improve within 2 days of starting treatment.  · Your symptoms return after you have recovered from a breast infection.  Get help right away if:  · Your pain and swelling are getting worse.  · You have pain that is not controlled with medicine.  · You have a red line extending from the breast toward your armpit.  Summary  · Mastitis is inflammation of the breast tissue. It is often caused by a blocked milk duct or bacteria.  · This condition may be treated with hot and cold compresses, medicines, self-care, and certain breastfeeding strategies.  · If you were prescribed an antibiotic medicine, take it as told by your health care provider. Do not stop taking the antibiotic even if you start to feel better.  · Continue to empty your breasts as often as possible either by breastfeeding or using an electric breast pump.  This information is not intended to replace advice given to you by your  health care provider. Make sure you discuss any questions you have with your health care provider.  Document Released: 05/09/2004 Document Revised: 01/14/2016 Document Reviewed: 01/14/2016  Elsevier Interactive Patient Education © 2017 Else[employer] Inc.

## 2017-04-15 NOTE — Progress Notes (Signed)
   PRENATAL VISIT NOTE  Subjective:  Casey Obrien is a 27 y.o. G2P1001 at 612w3d being seen today for ongoing prenatal care.  She is currently monitored for the following issues for this low-risk pregnancy and has Supervision of other normal pregnancy, antepartum; History of C-section; Vaginal bleeding; Language barrier; Carpal tunnel syndrome during pregnancy; and Constipation on their problem list.  Patient reports no complaints.  Contractions: Not present. Vag. Bleeding: None.  Movement: Present. Denies leaking of fluid.   The following portions of the patient's history were reviewed and updated as appropriate: allergies, current medications, past family history, past medical history, past social history, past surgical history and problem list. Problem list updated.  Objective:   Vitals:   04/15/17 0909 04/15/17 0955  BP: 95/66   Pulse: (!) 133 (!) 115    Fetal Status: Fetal Heart Rate (bpm): 163 Fundal Height: 39 cm Movement: Present     General:  Alert, oriented and cooperative. Patient is in no acute distress.  Skin: Skin is warm and dry. No rash noted.   Cardiovascular: Normal heart rate noted  Respiratory: Normal respiratory effort, no problems with respiration noted  Abdomen: Soft, gravid, appropriate for gestational age.  Pain/Pressure: Absent     Pelvic: Cervical exam deferred        Extremities: Normal range of motion.  Edema: None  Mental Status:  Normal mood and affect. Normal behavior. Normal judgment and thought content.   Assessment and Plan:  Pregnancy: G2P1001 at 7012w3d  1. Supervision of other normal pregnancy, antepartum -Doing well, no complaints.   2. Vaginal bleeding -None since initial episode  Term labor symptoms and general obstetric precautions including but not limited to vaginal bleeding, contractions, leaking of fluid and fetal movement were reviewed in detail with the patient. Please refer to After Visit Summary for other counseling recommendations.   Return in about 1 week (around 04/22/2017), or LROB.   Marylene LandKathryn Lorraine Kooistra, CNM

## 2017-04-19 ENCOUNTER — Encounter (HOSPITAL_COMMUNITY): Payer: Self-pay

## 2017-04-19 NOTE — Pre-Procedure Instructions (Signed)
Interpreter number [id number] 

## 2017-04-19 NOTE — Pre-Procedure Instructions (Signed)
Interpreter number [id number] 

## 2017-04-22 ENCOUNTER — Ambulatory Visit (INDEPENDENT_AMBULATORY_CARE_PROVIDER_SITE_OTHER): Payer: Medicaid Other | Admitting: Student

## 2017-04-22 VITALS — BP 102/65 | HR 119 | Wt 152.0 lb

## 2017-04-22 DIAGNOSIS — Z98891 History of uterine scar from previous surgery: Secondary | ICD-10-CM

## 2017-04-22 DIAGNOSIS — Z3480 Encounter for supervision of other normal pregnancy, unspecified trimester: Secondary | ICD-10-CM

## 2017-04-22 DIAGNOSIS — Z789 Other specified health status: Secondary | ICD-10-CM

## 2017-04-22 NOTE — Patient Instructions (Signed)
Cesarean Delivery Cesarean birth, or cesarean delivery, is the surgical delivery of a baby through an incision in the abdomen and the uterus. This may be referred to as a C-section. This procedure may be scheduled ahead of time, or it may be done in an emergency situation. Tell a health care provider about:  Any allergies you have.  All medicines you are taking, including vitamins, herbs, eye drops, creams, and over-the-counter medicines.  Any problems you or family members have had with anesthetic medicines.  Any blood disorders you have.  Any surgeries you have had.  Any medical conditions you have.  Whether you or any members of your family have a history of deep vein thrombosis (DVT) or pulmonary embolism (PE). What are the risks? Generally, this is a safe procedure. However, problems may occur, including:  Infection.  Bleeding.  Allergic reactions to medicines.  Damage to other structures or organs.  Blood clots.  Injury to your baby.  What happens before the procedure?  Follow instructions from your health care provider about eating or drinking restrictions.  Follow instructions from your health care provider about bathing before your procedure to help reduce your risk of infection.  If you know that you are going to have a cesarean delivery, do not shave your pubic area. Shaving before the procedure may increase your risk of infection.  Ask your health care provider about: ? Changing or stopping your regular medicines. This is especially important if you are taking diabetes medicines or blood thinners. ? Your pain management plan. This is especially important if you plan to breastfeed your baby. ? How long you will be in the hospital after the procedure. ? Any concerns you may have about receiving blood products if you need them during the procedure. ? Cord blood banking, if you plan to collect your baby's umbilical cord blood.  You may also want to ask your  health care provider: ? Whether you will be able to hold or breastfeed your baby while you are still in the operating room. ? Whether your baby can stay with you immediately after the procedure and during your recovery. ? Whether a family member or a person of your choice can go with you into the operating room and stay with you during the procedure, immediately after the procedure, and during your recovery.  Plan to have someone drive you home when you are discharged from the hospital. What happens during the procedure?  Fetal monitors will be placed on your abdomen to monitor your heart rate and your baby's heart rate.  Depending on the reason for your cesarean delivery, you may have a physical exam or additional testing, such as an ultrasound.  An IV tube will be inserted into one of your veins.  You may have your blood or urine tested.  You will be given antibiotic medicine to help prevent infection.  You may be given a special warming gown to wear to keep your temperature stable.  Hair may be removed from your pubic area.  The skin of your pubic area and lower abdomen will be cleaned with a germ-killing solution (antiseptic).  A catheter may be inserted into your bladder through your urethra. This drains your urine during the procedure.  You may be given one or more of the following: ? A medicine to numb the area (local anesthetic). ? A medicine to make you fall asleep (general anesthetic). ? A medicine (regional anesthetic) that is injected into your back or through a small   thin tube placed in your back (spinal anesthetic or epidural anesthetic). This numbs everything below the injection site and allows you to stay awake during your procedure. If this makes you feel nauseous, tell your health care provider. Medicines will be available to help reduce any nausea you may feel.  An incision will be made in your abdomen, and then in your uterus.  If you are awake during your  procedure, you may feel tugging and pulling in your abdomen, but you should not feel pain. If you feel pain, tell your health care provider immediately.  Your baby will be removed from your uterus. You may feel more pressure or pushing while this happens.  Immediately after birth, your baby will be dried and kept warm. You may be able to hold and breastfeed your baby. The umbilical cord may be clamped and cut during this time.  Your placenta will be removed from your uterus.  Your incisions will be closed with stitches (sutures). Staples, skin glue, or adhesive strips may also be applied to the incision in your abdomen.  Bandages (dressings) will be placed over the incision in your abdomen. The procedure may vary among health care providers and hospitals. What happens after the procedure?  Your blood pressure, heart rate, breathing rate, and blood oxygen level will be monitored often until the medicines you were given have worn off.  You may continue to receive fluids and medicines through an IV tube.  You will have some pain. Medicines will be available to help control your pain.  To help prevent blood clots: ? You may be given medicines. ? You may have to wear compression stockings or devices. ? You will be encouraged to walk around when you are able.  Hospital staff will encourage and support bonding with your baby. Your hospital may allow you and your baby to stay in the same room (rooming in) during your hospital stay to encourage successful breastfeeding.  You may be encouraged to cough and breathe deeply often. This helps to prevent lung problems.  If you have a catheter draining your urine, it will be removed as soon as possible after your procedure. This information is not intended to replace advice given to you by your health care provider. Make sure you discuss any questions you have with your health care provider. Document Released: 01/12/2005 Document Revised: 06/20/2015  Document Reviewed: 10/23/2014 Elsevier Interactive Patient Education  2018 [employer].  

## 2017-04-22 NOTE — Progress Notes (Signed)
   PRENATAL VISIT NOTE  Subjective:  Casey Obrien is a 27 y.o. G2P1001 at 5071w3d being seen today for ongoing prenatal care.  She is currently monitored for the following issues for this low-risk pregnancy and has Supervision of other normal pregnancy, antepartum; History of C-section; Vaginal bleeding; Language barrier; Carpal tunnel syndrome during pregnancy; and Constipation on their problem list.  Patient reports no complaints.  Contractions: Not present. Vag. Bleeding: None.  Movement: Present. Denies leaking of fluid.   The following portions of the patient's history were reviewed and updated as appropriate: allergies, current medications, past family history, past medical history, past social history, past surgical history and problem list. Problem list updated.  Objective:   Vitals:   04/22/17 1026  BP: 102/65  Pulse: (!) 119  Weight: 152 lb (68.9 kg)    Fetal Status: Fetal Heart Rate (bpm): 137 Fundal Height: 39 cm Movement: Present     General:  Alert, oriented and cooperative. Patient is in no acute distress.  Skin: Skin is warm and dry. No rash noted.   Cardiovascular: Normal heart rate noted  Respiratory: Normal respiratory effort, no problems with respiration noted  Abdomen: Soft, gravid, appropriate for gestational age.  Pain/Pressure: Present     Pelvic: Cervical exam deferred        Extremities: Normal range of motion.  Edema: None  Mental Status:  Normal mood and affect. Normal behavior. Normal judgment and thought content.   Assessment and Plan:  Pregnancy: G2P1001 at 571w3d  1. Supervision of other normal pregnancy, antepartum -doing well, no complaints  2. History of C-section -wants RCS. Scheduled 4/4 with Dr. Gillian Meeuwsen FullingHarraway-Smith  3. Language barrier -Arabic interpreter at bedside during visit -pt would like to use this particular interpreter for her c/section [name]. Request put in & [name] sent message for possible c/s later in the day d/t  interpreter's availability.   Term labor symptoms and general obstetric precautions including but not limited to vaginal bleeding, contractions, leaking of fluid and fetal movement were reviewed in detail with the patient. Please refer to After Visit Summary for other counseling recommendations.  Return in about 6 days (around 04/28/2017).   Judeth HornErin Greyson Peavy, NP

## 2017-04-27 NOTE — Patient Instructions (Signed)
Casey BeamSeba Obrien  04/27/2017   Your procedure is scheduled on:  04/29/2017  [facility] at 0730 AM.  Pick up the phone at the desk and dial [phone]  Call this number if you have problems the morning of [phone]  Remember:   Do not eat food:(After Midnight) Desps de medianoche.  Do not drink clear liquids: (After Midnight) Desps de medianoche.  Take these medicines the morning of surgery with A SIP OF WATER: none   Do not wear jewelry, make-up or nail polish.  Do not wear lotions, powders, or perfumes. Do not wear deodorant.  Do not shave 48 hours prior to surgery.  Do not bring valuables to the hospital.  [facility] is not   responsible for any belongings or valuables brought to the hospital.  Contacts, dentures or bridgework may not be worn into surgery.  Leave suitcase in the car. After surgery it may be brought to your room.  For patients admitted to the hospital, checkout time is 11:00 AM the day of              discharge.    N/A   Please read over the following fact sheets that you were given:   Surgical Site Infection Prevention

## 2017-04-28 ENCOUNTER — Encounter (HOSPITAL_COMMUNITY)
Admission: RE | Admit: 2017-04-28 | Discharge: 2017-04-28 | Disposition: A | Discharge status: Home / Self Care | Payer: Medicaid Other | Source: Ambulatory Visit | Attending: Obstetrics & Gynecology | Admitting: Obstetrics & Gynecology

## 2017-04-28 ENCOUNTER — Ambulatory Visit (INDEPENDENT_AMBULATORY_CARE_PROVIDER_SITE_OTHER): Payer: Medicaid Other | Admitting: Obstetrics & Gynecology

## 2017-04-28 VITALS — BP 104/62 | HR 110 | Wt 156.3 lb

## 2017-04-28 DIAGNOSIS — Z3483 Encounter for supervision of other normal pregnancy, third trimester: Secondary | ICD-10-CM

## 2017-04-28 DIAGNOSIS — Z3480 Encounter for supervision of other normal pregnancy, unspecified trimester: Secondary | ICD-10-CM

## 2017-04-28 DIAGNOSIS — Z98891 History of uterine scar from previous surgery: Secondary | ICD-10-CM

## 2017-04-28 LAB — CBC
HCT: 29.2 % — ABNORMAL LOW (ref 36.0–46.0)
Hemoglobin: 9.3 g/dL — ABNORMAL LOW (ref 12.0–15.0)
MCH: 24.2 pg — ABNORMAL LOW (ref 26.0–34.0)
MCHC: 31.8 g/dL (ref 30.0–36.0)
MCV: 76 fL — ABNORMAL LOW (ref 78.0–100.0)
PLATELETS: 149 10*3/uL — AB (ref 150–400)
RBC: 3.84 MIL/uL — ABNORMAL LOW (ref 3.87–5.11)
RDW: 16.2 % — AB (ref 11.5–15.5)
WBC: 10 10*3/uL (ref 4.0–10.5)

## 2017-04-28 NOTE — Progress Notes (Signed)
   PRENATAL VISIT NOTE  Subjective:  Casey Obrien is a 27 y.o. G2P1001 at 9580w2d being seen today for ongoing prenatal care.  She is currently monitored for the following issues for this low-risk pregnancy and has Supervision of other normal pregnancy, antepartum; History of C-section; Vaginal bleeding; Language barrier; Carpal tunnel syndrome during pregnancy; and Constipation on their problem list.  Patient reports no complaints.  Contractions: Not present. Vag. Bleeding: None.  Movement: Present. Denies leaking of fluid.   The following portions of the patient's history were reviewed and updated as appropriate: allergies, current medications, past family history, past medical history, past social history, past surgical history and problem list. Problem list updated.  Objective:   Vitals:   04/28/17 1429  BP: 104/62  Pulse: (!) 110  Weight: 156 lb 4.8 oz (70.9 kg)    Fetal Status: Fetal Heart Rate (bpm): 135   Movement: Present     General:  Alert, oriented and cooperative. Patient is in no acute distress.  Skin: Skin is warm and dry. No rash noted.   Cardiovascular: Normal heart rate noted  Respiratory: Normal respiratory effort, no problems with respiration noted  Abdomen: Soft, gravid, appropriate for gestational age.  Pain/Pressure: Present     Pelvic: Cervical exam deferred        Extremities: Normal range of motion.  Edema: Trace  Mental Status: Normal mood and affect. Normal behavior. Normal judgment and thought content.   Assessment and Plan:  Pregnancy: G2P1001 at 5880w2d  1. History of C-section - she has a repeat c/s tommorrow  2. Supervision of other normal pregnancy, antepartum   Term labor symptoms and general obstetric precautions including but not limited to vaginal bleeding, contractions, leaking of fluid and fetal movement were reviewed in detail with the patient. Please refer to After Visit Summary for other counseling recommendations.  Return in about 1  month (around 05/26/2017) for postop visit.  No future appointments.  Allie BossierMyra C Juana Haralson, MD

## 2017-04-29 ENCOUNTER — Inpatient Hospital Stay (HOSPITAL_COMMUNITY): Payer: Medicaid Other | Admitting: Anesthesiology

## 2017-04-29 ENCOUNTER — Encounter (HOSPITAL_COMMUNITY): Payer: Self-pay | Admitting: Certified Registered Nurse Anesthetist

## 2017-04-29 ENCOUNTER — Inpatient Hospital Stay (HOSPITAL_COMMUNITY)
Admission: AD | Admit: 2017-04-29 | Discharge: 2017-05-01 | DRG: 787 | Disposition: A | Discharge status: Home / Self Care | Payer: Medicaid Other | Source: Ambulatory Visit | Attending: Obstetrics and Gynecology | Admitting: Obstetrics and Gynecology

## 2017-04-29 ENCOUNTER — Encounter (HOSPITAL_COMMUNITY)
Admission: AD | Disposition: A | Discharge status: Home / Self Care | Payer: Self-pay | Source: Ambulatory Visit | Attending: Obstetrics and Gynecology

## 2017-04-29 DIAGNOSIS — O9081 Anemia of the puerperium: Secondary | ICD-10-CM | POA: Diagnosis not present

## 2017-04-29 DIAGNOSIS — Z3A39 39 weeks gestation of pregnancy: Secondary | ICD-10-CM | POA: Diagnosis not present

## 2017-04-29 DIAGNOSIS — Z758 Other problems related to medical facilities and other health care: Secondary | ICD-10-CM | POA: Diagnosis present

## 2017-04-29 DIAGNOSIS — Z3480 Encounter for supervision of other normal pregnancy, unspecified trimester: Secondary | ICD-10-CM

## 2017-04-29 DIAGNOSIS — Z789 Other specified health status: Secondary | ICD-10-CM | POA: Diagnosis present

## 2017-04-29 DIAGNOSIS — O34211 Maternal care for low transverse scar from previous cesarean delivery: Principal | ICD-10-CM | POA: Diagnosis present

## 2017-04-29 DIAGNOSIS — D62 Acute posthemorrhagic anemia: Secondary | ICD-10-CM | POA: Diagnosis not present

## 2017-04-29 DIAGNOSIS — Z98891 History of uterine scar from previous surgery: Secondary | ICD-10-CM

## 2017-04-29 LAB — RPR: RPR Ser Ql: NONREACTIVE

## 2017-04-29 SURGERY — Surgical Case
Anesthesia: Spinal | Site: Abdomen | Wound class: Clean Contaminated

## 2017-04-29 MED ORDER — DIPHENHYDRAMINE HCL 50 MG/ML IJ SOLN
12.5000 mg | INTRAMUSCULAR | Status: DC | PRN
  Administered 2017-04-29: 12.5 mg via INTRAVENOUS
  Filled 2017-04-29: qty 1

## 2017-04-29 MED ORDER — MENTHOL 3 MG MT LOZG: 1.0000 | LOZENGE | OROMUCOSAL | Status: DC | PRN

## 2017-04-29 MED ORDER — METOCLOPRAMIDE HCL 5 MG/ML IJ SOLN
INTRAMUSCULAR | Status: AC
  Filled 2017-04-29: qty 2

## 2017-04-29 MED ORDER — FENTANYL CITRATE (PF) 100 MCG/2ML IJ SOLN
INTRAMUSCULAR | Status: AC
  Filled 2017-04-29: qty 2

## 2017-04-29 MED ORDER — DIPHENHYDRAMINE HCL 25 MG PO CAPS: 25.0000 mg | ORAL_CAPSULE | Freq: Four times a day (QID) | ORAL | Status: DC | PRN

## 2017-04-29 MED ORDER — PHENYLEPHRINE 8 MG IN D5W 100 ML (0.08MG/ML) PREMIX OPTIME
INJECTION | INTRAVENOUS | Status: AC
  Filled 2017-04-29: qty 100

## 2017-04-29 MED ORDER — STERILE WATER FOR IRRIGATION IR SOLN
Status: DC | PRN
  Administered 2017-04-29: 1000 mL

## 2017-04-29 MED ORDER — METOCLOPRAMIDE HCL 5 MG/ML IJ SOLN
INTRAMUSCULAR | Status: DC | PRN
  Administered 2017-04-29: 10 mg via INTRAVENOUS

## 2017-04-29 MED ORDER — OXYTOCIN 40 UNITS IN LACTATED RINGERS INFUSION - SIMPLE MED: 2.5000 [IU]/h | INTRAVENOUS | Status: AC

## 2017-04-29 MED ORDER — BUPIVACAINE HCL (PF) 0.25 % IJ SOLN
INTRAMUSCULAR | Status: AC
  Filled 2017-04-29: qty 30

## 2017-04-29 MED ORDER — PROMETHAZINE HCL 25 MG/ML IJ SOLN
INTRAMUSCULAR | Status: AC
  Administered 2017-04-29: 6.25 mg via INTRAVENOUS
  Filled 2017-04-29: qty 1

## 2017-04-29 MED ORDER — WITCH HAZEL-GLYCERIN EX PADS: 1.0000 "application " | MEDICATED_PAD | CUTANEOUS | Status: DC | PRN

## 2017-04-29 MED ORDER — SODIUM CHLORIDE 0.9 % IR SOLN
Status: DC | PRN
  Administered 2017-04-29: 1000 mL

## 2017-04-29 MED ORDER — NALBUPHINE HCL 10 MG/ML IJ SOLN: 5.0000 mg | INTRAMUSCULAR | Status: DC | PRN

## 2017-04-29 MED ORDER — PROMETHAZINE HCL 25 MG/ML IJ SOLN
6.2500 mg | Freq: Once | INTRAMUSCULAR | Status: AC
  Administered 2017-04-29: 6.25 mg via INTRAVENOUS

## 2017-04-29 MED ORDER — NALOXONE HCL 0.4 MG/ML IJ SOLN: 0.4000 mg | INTRAMUSCULAR | Status: DC | PRN

## 2017-04-29 MED ORDER — MORPHINE SULFATE (PF) 0.5 MG/ML IJ SOLN
INTRAMUSCULAR | Status: AC
  Filled 2017-04-29: qty 10

## 2017-04-29 MED ORDER — IBUPROFEN 600 MG PO TABS
600.0000 mg | ORAL_TABLET | Freq: Four times a day (QID) | ORAL | Status: DC
  Administered 2017-04-29 – 2017-05-01 (×8): 600 mg via ORAL
  Filled 2017-04-29 (×8): qty 1

## 2017-04-29 MED ORDER — SIMETHICONE 80 MG PO CHEW
80.0000 mg | CHEWABLE_TABLET | ORAL | Status: DC
  Administered 2017-04-29 – 2017-04-30 (×2): 80 mg via ORAL
  Filled 2017-04-29 (×2): qty 1

## 2017-04-29 MED ORDER — SCOPOLAMINE 1 MG/3DAYS TD PT72: 1.0000 | MEDICATED_PATCH | Freq: Once | TRANSDERMAL | Status: DC

## 2017-04-29 MED ORDER — DEXAMETHASONE SODIUM PHOSPHATE 4 MG/ML IJ SOLN
INTRAMUSCULAR | Status: AC
  Filled 2017-04-29: qty 1

## 2017-04-29 MED ORDER — MORPHINE SULFATE (PF) 0.5 MG/ML IJ SOLN
INTRAMUSCULAR | Status: DC | PRN
  Administered 2017-04-29: .1 mg via EPIDURAL

## 2017-04-29 MED ORDER — SIMETHICONE 80 MG PO CHEW: 80.0000 mg | CHEWABLE_TABLET | ORAL | Status: DC | PRN

## 2017-04-29 MED ORDER — SIMETHICONE 80 MG PO CHEW
80.0000 mg | CHEWABLE_TABLET | Freq: Three times a day (TID) | ORAL | Status: DC
  Administered 2017-04-29 – 2017-05-01 (×5): 80 mg via ORAL
  Filled 2017-04-29 (×4): qty 1

## 2017-04-29 MED ORDER — TETANUS-DIPHTH-ACELL PERTUSSIS 5-2.5-18.5 LF-MCG/0.5 IM SUSP: 0.5000 mL | Freq: Once | INTRAMUSCULAR | Status: DC

## 2017-04-29 MED ORDER — OXYCODONE HCL 5 MG PO TABS: 10.0000 mg | ORAL_TABLET | ORAL | Status: DC | PRN

## 2017-04-29 MED ORDER — LACTATED RINGERS IV SOLN
INTRAVENOUS | Status: DC | PRN
  Administered 2017-04-29: 10:00:00 via INTRAVENOUS

## 2017-04-29 MED ORDER — DIBUCAINE 1 % RE OINT
1.0000 | TOPICAL_OINTMENT | RECTAL | Status: DC | PRN
Start: 2017-04-29 — End: 2017-05-01

## 2017-04-29 MED ORDER — DIPHENHYDRAMINE HCL 25 MG PO CAPS
25.0000 mg | ORAL_CAPSULE | ORAL | Status: DC | PRN
  Filled 2017-04-29: qty 1

## 2017-04-29 MED ORDER — PRENATAL MULTIVITAMIN CH
1.0000 | ORAL_TABLET | Freq: Every day | ORAL | Status: DC
  Administered 2017-04-30 – 2017-05-01 (×2): 1 via ORAL
  Filled 2017-04-29 (×2): qty 1

## 2017-04-29 MED ORDER — LACTATED RINGERS IV SOLN
INTRAVENOUS | Status: DC
  Administered 2017-04-29 (×2): via INTRAVENOUS

## 2017-04-29 MED ORDER — OXYTOCIN 10 UNIT/ML IJ SOLN
INTRAMUSCULAR | Status: DC | PRN
  Administered 2017-04-29: 40 [IU] via INTRAVENOUS

## 2017-04-29 MED ORDER — ZOLPIDEM TARTRATE 5 MG PO TABS: 5.0000 mg | ORAL_TABLET | Freq: Every evening | ORAL | Status: DC | PRN

## 2017-04-29 MED ORDER — LACTATED RINGERS IV SOLN
INTRAVENOUS | Status: DC
  Administered 2017-04-29: 125 mL/h via INTRAVENOUS

## 2017-04-29 MED ORDER — ENOXAPARIN SODIUM 40 MG/0.4ML ~~LOC~~ SOLN
40.0000 mg | SUBCUTANEOUS | Status: DC
  Administered 2017-04-30: 40 mg via SUBCUTANEOUS
  Filled 2017-04-29 (×2): qty 0.4

## 2017-04-29 MED ORDER — MEPERIDINE HCL 25 MG/ML IJ SOLN: 6.2500 mg | INTRAMUSCULAR | Status: DC | PRN

## 2017-04-29 MED ORDER — FENTANYL CITRATE (PF) 100 MCG/2ML IJ SOLN
25.0000 ug | INTRAMUSCULAR | Status: DC | PRN
  Administered 2017-04-29: 25 ug via INTRAVENOUS

## 2017-04-29 MED ORDER — ONDANSETRON HCL 4 MG/2ML IJ SOLN
4.0000 mg | Freq: Three times a day (TID) | INTRAMUSCULAR | Status: DC | PRN
  Administered 2017-04-29: 4 mg via INTRAVENOUS

## 2017-04-29 MED ORDER — BUPIVACAINE HCL (PF) 0.25 % IJ SOLN
INTRAMUSCULAR | Status: DC | PRN
  Administered 2017-04-29: 30 mL

## 2017-04-29 MED ORDER — ACETAMINOPHEN 325 MG PO TABS
650.0000 mg | ORAL_TABLET | ORAL | Status: DC | PRN
  Administered 2017-04-30 – 2017-05-01 (×2): 650 mg via ORAL
  Filled 2017-04-29 (×2): qty 2

## 2017-04-29 MED ORDER — PHENYLEPHRINE 8 MG IN D5W 100 ML (0.08MG/ML) PREMIX OPTIME
INJECTION | INTRAVENOUS | Status: DC | PRN
  Administered 2017-04-29: 60 ug/min via INTRAVENOUS

## 2017-04-29 MED ORDER — NALBUPHINE HCL 10 MG/ML IJ SOLN: 5.0000 mg | Freq: Once | INTRAMUSCULAR | Status: DC | PRN

## 2017-04-29 MED ORDER — OXYCODONE HCL 5 MG PO TABS: 5.0000 mg | ORAL_TABLET | ORAL | Status: DC | PRN

## 2017-04-29 MED ORDER — ONDANSETRON HCL 4 MG/2ML IJ SOLN
INTRAMUSCULAR | Status: AC
  Filled 2017-04-29: qty 2

## 2017-04-29 MED ORDER — SODIUM CHLORIDE 0.9% FLUSH: 3.0000 mL | INTRAVENOUS | Status: DC | PRN

## 2017-04-29 MED ORDER — SENNOSIDES-DOCUSATE SODIUM 8.6-50 MG PO TABS
2.0000 | ORAL_TABLET | ORAL | Status: DC
  Administered 2017-04-29 – 2017-04-30 (×2): 2 via ORAL
  Filled 2017-04-29 (×2): qty 2

## 2017-04-29 MED ORDER — OXYTOCIN 10 UNIT/ML IJ SOLN
INTRAMUSCULAR | Status: AC
  Filled 2017-04-29: qty 4

## 2017-04-29 MED ORDER — CEFAZOLIN SODIUM-DEXTROSE 2-4 GM/100ML-% IV SOLN
2.0000 g | INTRAVENOUS | Status: AC
  Administered 2017-04-29: 2 g via INTRAVENOUS
  Filled 2017-04-29: qty 100

## 2017-04-29 MED ORDER — NALOXONE HCL 4 MG/10ML IJ SOLN
1.0000 ug/kg/h | INTRAVENOUS | Status: DC | PRN
  Filled 2017-04-29: qty 5

## 2017-04-29 MED ORDER — ACETAMINOPHEN 160 MG/5ML PO SOLN
975.0000 mg | Freq: Once | ORAL | Status: AC
  Administered 2017-04-29: 975 mg via ORAL
  Filled 2017-04-29: qty 40.6

## 2017-04-29 MED ORDER — COCONUT OIL OIL
1.0000 "application " | TOPICAL_OIL | Status: DC | PRN
  Administered 2017-05-01: 1 via TOPICAL
  Filled 2017-04-29: qty 120

## 2017-04-29 MED ORDER — BUPIVACAINE IN DEXTROSE 0.75-8.25 % IT SOLN
INTRATHECAL | Status: DC | PRN
  Administered 2017-04-29: 1.2 mL via INTRATHECAL

## 2017-04-29 MED ORDER — DIPHENHYDRAMINE HCL 50 MG/ML IJ SOLN: 12.5000 mg | INTRAMUSCULAR | Status: DC | PRN

## 2017-04-29 SURGICAL SUPPLY — 31 items
BENZOIN TINCTURE PRP APPL 2/3 (GAUZE/BANDAGES/DRESSINGS) ×3 IMPLANT
BLADE TIP J-PLASMA PRECISE LAP (MISCELLANEOUS) ×3 IMPLANT
CHLORAPREP W/TINT 26ML (MISCELLANEOUS) ×3 IMPLANT
CLAMP CORD UMBIL (MISCELLANEOUS) ×3 IMPLANT
CLOSURE WOUND 1/2 X4 (GAUZE/BANDAGES/DRESSINGS) ×1
CLOTH BEACON ORANGE TIMEOUT ST (SAFETY) ×3 IMPLANT
DRSG OPSITE POSTOP 4X10 (GAUZE/BANDAGES/DRESSINGS) ×3 IMPLANT
ELECT REM PT RETURN 9FT ADLT (ELECTROSURGICAL) ×3
ELECTRODE REM PT RTRN 9FT ADLT (ELECTROSURGICAL) ×1 IMPLANT
GLOVE BIO SURGEON STRL SZ7 (GLOVE) ×3 IMPLANT
GLOVE BIOGEL PI IND STRL 7.0 (GLOVE) ×2 IMPLANT
GLOVE BIOGEL PI INDICATOR 7.0 (GLOVE) ×4
GOWN STRL REUS W/TWL LRG LVL3 (GOWN DISPOSABLE) ×6 IMPLANT
GOWN STRL REUS W/TWL XL LVL3 (GOWN DISPOSABLE) ×3 IMPLANT
NEEDLE HYPO 22GX1.5 SAFETY (NEEDLE) ×3 IMPLANT
NS IRRIG 1000ML POUR BTL (IV SOLUTION) ×3 IMPLANT
PACK C SECTION WH (CUSTOM PROCEDURE TRAY) ×3 IMPLANT
PAD ABD 7.5X8 STRL (GAUZE/BANDAGES/DRESSINGS) ×3 IMPLANT
PAD OB MATERNITY 4.3X12.25 (PERSONAL CARE ITEMS) ×3 IMPLANT
PENCIL SMOKE EVAC W/HOLSTER (ELECTROSURGICAL) ×3 IMPLANT
SPONGE GAUZE 4X4 12PLY STER LF (GAUZE/BANDAGES/DRESSINGS) ×6 IMPLANT
STRIP CLOSURE SKIN 1/2X4 (GAUZE/BANDAGES/DRESSINGS) ×2 IMPLANT
SUT PDS AB 0 CTX 60 (SUTURE) ×3 IMPLANT
SUT VIC AB 0 CT1 36 (SUTURE) ×9 IMPLANT
SUT VIC AB 3-0 CT1 27 (SUTURE) ×2
SUT VIC AB 3-0 CT1 TAPERPNT 27 (SUTURE) ×1 IMPLANT
SUT VIC AB 4-0 KS 27 (SUTURE) ×3 IMPLANT
SYR CONTROL 10ML LL (SYRINGE) ×3 IMPLANT
TAPE CLOTH SURG 4X10 WHT LF (GAUZE/BANDAGES/DRESSINGS) ×3 IMPLANT
TOWEL OR 17X24 6PK STRL BLUE (TOWEL DISPOSABLE) ×3 IMPLANT
TRAY FOLEY BAG SILVER LF 14FR (SET/KITS/TRAYS/PACK) ×3 IMPLANT

## 2017-04-29 NOTE — Progress Notes (Signed)
NOTIFIED ANESTHESIA, DR Jean RosenthalJACKSON, ABOUT ORTHOSTSIC VS, URINE OUTPUT 175ML OVER 6 HOURS, AND PT REPORTS SLIGHT DIZZINESS WHEN STANDING.  ORDER RECEIVED FOR INCREASED PO FLUIDS AND REPEAT ORTHOSTIC VS IN 4HRS.

## 2017-04-29 NOTE — Anesthesia Postprocedure Evaluation (Signed)
Anesthesia Post Note  Patient: Casey Obrien  Procedure(s) Performed: REPEAT CESAREAN SECTION (N/A Abdomen)     Patient location during evaluation: Mother Baby Anesthesia Type: Spinal Level of consciousness: awake and alert and oriented Pain management: satisfactory to patient Vital Signs Assessment: post-procedure vital signs reviewed and stable Respiratory status: spontaneous breathing and nonlabored ventilation Cardiovascular status: stable Postop Assessment: no headache, no backache, patient able to bend at knees, no signs of nausea or vomiting and adequate PO intake Anesthetic complications: no    Last Vitals:  Vitals:   04/29/17 1730 04/29/17 1827  BP:    Pulse:    Resp:    Temp:    SpO2: 97% 97%    Last Pain:  Vitals:   04/29/17 1638  TempSrc: Oral  PainSc:    Pain Goal:                 Madison HickmanGREGORY,Mattew Chriswell

## 2017-04-29 NOTE — Anesthesia Postprocedure Evaluation (Signed)
Anesthesia Post Note  Patient: Casey Obrien  Procedure(s) Performed: REPEAT CESAREAN SECTION (N/A Abdomen)     Patient location during evaluation: PACU Anesthesia Type: Spinal Level of consciousness: awake Pain management: satisfactory to patient Vital Signs Assessment: post-procedure vital signs reviewed and stable Respiratory status: spontaneous breathing Cardiovascular status: blood pressure returned to baseline Postop Assessment: no headache and spinal receding Anesthetic complications: no    Last Vitals:  Vitals:   04/29/17 1546 04/29/17 1638  BP: (!) 93/48   Pulse: 81   Resp: 18   Temp: 36.7 C 36.9 C  SpO2: 96%     Last Pain:  Vitals:   04/29/17 1638  TempSrc: Oral  PainSc:    Pain Goal:                 Jiles GarterJACKSON,Jocsan Mcginley EDWARD

## 2017-04-29 NOTE — Progress Notes (Signed)
Interpreter Casey Obrien is translates maternal and infant safety and care. Family states understanding information.

## 2017-04-29 NOTE — Transfer of Care (Signed)
Immediate Anesthesia Transfer of Care Note  Patient: Cataleyah Garry  Procedure(s) Performed: REPEAT CESAREAN SECTION (N/A Abdomen)  Patient Location: PACU  Anesthesia Type:Spinal  Level of Consciousness: awake, alert  and oriented  Airway & Oxygen Therapy: Patient Spontanous Breathing and Patient connected to nasal cannula oxygen  Post-op Assessment: Report given to RN and Post -op Vital signs reviewed and stable  Post vital signs: Reviewed and stable  Last Vitals:  Vitals Value Taken Time  BP 101/69 04/29/2017 11:15 AM  Temp 36.6 C 04/29/2017 11:10 AM  Pulse 105 04/29/2017 11:23 AM  Resp 16 04/29/2017 11:23 AM  SpO2 100 % 04/29/2017 11:23 AM  Vitals shown include unvalidated device data.  Last Pain:  Vitals:   04/29/17 1110  TempSrc: Oral  PainSc:          Complications: No apparent anesthesia complications

## 2017-04-29 NOTE — Op Note (Signed)
Cesarean Section Operative Report  PATIENT: Casey Obrien  PROCEDURE DATE: 04/29/2017  PREOPERATIVE DIAGNOSES: Intrauterine pregnancy at [redacted]w[redacted]d weeks gestation; history of prior Cesarean Secion  POSTOPERATIVE DIAGNOSES: The same  PROCEDURE: Repeat Low Transverse Cesarean Section  SURGEON:   Surgeon(s) and Role:    * Willodean Rosenthal, MD - Primary    * Degele, Kandra Nicolas, MD - OB Fellow   INDICATIONS: Casey Obrien is a 27 y.o. [id number] at [redacted]w[redacted]d here for cesarean section secondary to the indications listed under preoperative diagnoses; please see preoperative note for further details.  The risks of cesarean section were discussed with the patient including but were not limited to: bleeding which may require transfusion or reoperation; infection which may require antibiotics; injury to bowel, bladder, ureters or other surrounding organs; injury to the fetus; need for additional procedures including hysterectomy in the event of a life-threatening hemorrhage; placental abnormalities wth subsequent pregnancies, incisional problems, thromboembolic phenomenon and other postoperative/anesthesia complications.   The patient concurred with the proposed plan, giving informed written consent for the procedure.    FINDINGS:  Viable female infant in cephalic presentation.  Apgars 9 and 9.  Clear amniotic fluid.  Intact placenta, three vessel cord.  Normal uterus, fallopian tubes and ovaries bilaterally.  ANESTHESIA: Spinal INTRAVENOUS FLUIDS: 2500 ml ESTIMATED BLOOD LOSS: 952 mL URINE OUTPUT:   SPECIMENS: Placenta sent to L&D COMPLICATIONS: None immediate  PROCEDURE IN DETAIL:  The patient preoperatively received intravenous antibiotics and had sequential compression devices applied to her lower extremities.  She was then taken to the operating room where spinal anesthesia was administered  and was found to be adequate. She was then placed in a dorsal supine position with a leftward tilt, and prepped and  draped in a sterile manner.  A foley catheter was placed into her bladder and attached to constant gravity.    After an adequate timeout was performed, a Pfannenstiel skin incision was made with scalpel over her preexisting scar and carried through to the underlying layer of fascia. The fascia was incised in the midline, and this incision was extended bilaterally using the Mayo scissors.  Kocher clamps were applied to the superior aspect of the fascial incision and the underlying rectus muscles were dissected off bluntly and with Mayo scissors.  A similar process was carried out on the inferior aspect of the fascial incision. The rectus muscles were separated in the midline bluntly and the peritoneum was entered bluntly. Attention was turned to the lower uterine segment where a low transverse hysterotomy was made with a scalpel and extended bilaterally bluntly.  The infant was successfully delivered, the cord was clamped and cut after one minute, and the infant was handed over to the awaiting neonatology team. Uterine massage was then administered, and the placenta delivered intact with a three-vessel cord. The uterus was then cleared of clots and debris.  The hysterotomy was closed with 0 Vicryl in a running locked fashion, and an imbricating layer was also placed with 0 Vicryl.  Several serosal stitches were placed to help with hemostasis.  The pelvis was cleared of all clot and debris. Hemostasis was confirmed on all surfaces.  The peritoneum and the rectus muscles were reapproximated using 0 Vicryl single stitch. The fascia was then closed using 0 Vicryl in a running fashion.  The subcutaneous layer was irrigated, and 30 ml of 0.5% Marcaine was injected subcutaneously around the incision.  The skin was closed with a 4-0 Vicryl subcuticular stitch.   The patient tolerated the  procedure well. Sponge, lap, instrument and needle counts were correct x 3.  She was taken to the recovery room in stable condition.    Maternal Disposition: PACU - hemodynamically stable.   Fetal Condition: stable, skin-to-skin with mother   Signed: Frederik PearJulie P Degele, MD OB Fellow 04/29/2017 11:05 AM

## 2017-04-29 NOTE — Addendum Note (Signed)
Addendum  created 04/29/17 1840 by Shanon PayorGregory, Tarrance Januszewski M, CRNA   Sign clinical note

## 2017-04-29 NOTE — Anesthesia Preprocedure Evaluation (Signed)
Anesthesia Evaluation  Patient identified by MRN, date of birth, ID band Patient awake    Reviewed: Allergy & Precautions, H&P , Patient's Chart, lab work & pertinent test results  Airway Mallampati: II  TM Distance: >3 FB Neck ROM: full    Dental no notable dental hx.    Pulmonary    Pulmonary exam normal breath sounds clear to auscultation       Cardiovascular Exercise Tolerance: Good  Rhythm:regular Rate:Normal     Neuro/Psych    GI/Hepatic   Endo/Other    Renal/GU      Musculoskeletal   Abdominal   Peds  Hematology  (+) anemia ,   Anesthesia Other Findings   Reproductive/Obstetrics                             Anesthesia Physical Anesthesia Plan  ASA: II  Anesthesia Plan: Spinal   Post-op Pain Management:    Induction:   PONV Risk Score and Plan: 1 and Dexamethasone, Ondansetron and Treatment may vary due to age or medical condition  Airway Management Planned:   Additional Equipment:   Intra-op Plan:   Post-operative Plan:   Informed Consent: I have reviewed the patients History and Physical, chart, labs and discussed the procedure including the risks, benefits and alternatives for the proposed anesthesia with the patient or authorized representative who has indicated his/her understanding and acceptance.     Plan Discussed with:   Anesthesia Plan Comments: (  )        Anesthesia Quick Evaluation

## 2017-04-29 NOTE — Anesthesia Procedure Notes (Signed)
Spinal  Patient location during procedure: OR Staffing Anesthesiologist: Brandn Mcgath, MD Spinal Block Patient position: sitting Prep: DuraPrep Patient monitoring: heart rate, blood pressure and continuous pulse ox Approach: right paramedian Location: L3-4 Injection technique: single-shot Needle Needle type: Sprotte  Needle gauge: 24 G Needle length: 9 cm Assessment Sensory level: T4 Additional Notes Spinal Dosage in OR  .75% Bupivicaine ml       1.2     [id number]   mcg        100    Fentanyl mcg            25      

## 2017-04-29 NOTE — H&P (Signed)
Obstetric Preoperative History and Physical  Casey Obrien is a 27 y.o. G2P1001 with IUP at 6770w3d prWynne Dustesenting for presenting for scheduled repeat cesarean section.  No acute concerns.   Prenatal Course [facility] Pregnancy complications or risks: Patient Active Problem List   Diagnosis Date Noted  . Constipation 04/08/2017  . Carpal tunnel syndrome during pregnancy 03/23/2017  . Language barrier 02/23/2017  . Vaginal bleeding 12/31/2016  . Supervision of other normal pregnancy, antepartum 12/03/2016  . History of C-section 12/03/2016   She plans to breastfeed She desires natural family planning (NFP) for postpartum contraception.   Prenatal labs and studies: ABO, Rh: --/--/A POS (04/03 1035) Antibody: NEG (04/03 1035) Rubella: 7.58 (11/08 1126) RPR: Non Reactive (04/03 1035)  HBsAg: Negative (11/08 1126)  HIV: Non Reactive (02/26 1348)  GBS:  3 hr GTT  WNL Genetic screening not done Anatomy US normal  Prenatal Transfer Tool  Maternal Diabetes: No Genetic Screening: Declined Maternal Ultrasounds/Referrals: Normal Fetal Ultrasounds or other Referrals:  None Maternal Substance Abuse:  No Significant Maternal Medications:  None Significant Maternal Lab Results: None  Past Medical History:  Diagnosis Date  . Medical history non-contributory     Past Surgical History:  Procedure Laterality Date  . APPENDECTOMY    . CESAREAN SECTION     cervix not dilating   . TONSILLECTOMY      OB History  Gravida Para Term Preterm AB Living  2 1 1     1   SAB TAB Ectopic Multiple Live Births          1    # Outcome Date GA Lbr Len/2nd Weight Sex Delivery Anes PTL Lv  2 Current           1 Term 02/2016 [redacted]w[redacted]d  8 lb (3.629 kg) M CS-LTranv Spinal N LIV    Social History   Socioeconomic History  . Marital status: Married    Spouse name: Not on file  . Number of children: Not on file  . Years of education: Not on file  . Highest education level: Not on file   Occupational History  . Not on file  Social Needs  . Financial resource strain: Not on file  . Food insecurity:    Worry: Not on file    Inability: Not on file  . Transportation needs:    Medical: Not on file    Non-medical: Not on file  Tobacco Use  . Smoking status: Never Smoker  . Smokeless tobacco: Never Used  Substance and Sexual Activity  . Alcohol use: No  . Drug use: No  . Sexual activity: Yes    Birth control/protection: None  Lifestyle  . Physical activity:    Days per week: Not on file    Minutes per session: Not on file  . Stress: Not on file  Relationships  . Social connections:    Talks on phone: Not on file    Gets together: Not on file    Attends religious service: Not on file    Active member of club or organization: Not on file    Attends meetings of clubs or organizations: Not on file    Relationship status: Not on file  Other Topics Concern  . Not on file  Social History Narrative  . Not on file    Family History  Problem Relation Age of Onset  . Hypertension Mother   . Heart disease Father   . Diabetes Father   . Hypertension Father  Medications Prior to Admission  Medication Sig Dispense Refill Last Dose  . acetaminophen (TYLENOL) 325 MG tablet Take 2 tablets (650 mg total) by mouth 3 (three) times daily. (Patient taking differently: Take 650 mg by mouth at bedtime as needed for moderate pain or headache. ) 30 tablet 0 Taking  . Docusate Sodium 100 MG capsule Take 1 tablet (100 mg total) by mouth 3 (three) times daily. (Patient taking differently: Take 100 mg by mouth at bedtime as needed for constipation. ) 30 tablet 0 Taking  . FIBER PO Take 1 capsule by mouth daily as needed (constipation).   Taking  . folic acid (FOLVITE) 400 MCG tablet Take 400 mcg by mouth daily.   Taking  . pyridOXINE (VITAMIN B-6) 100 MG tablet Take 100 mg by mouth daily.   Taking  . lidocaine (LIDODERM) 5 % Place 1 patch onto the skin daily. Remove & Discard  patch within 12 hours or as directed by MD (Patient not taking: Reported on 04/26/2017) 30 patch 0 Not Taking  . ranitidine (ZANTAC) 150 MG tablet Take 1 tablet (150 mg total) by mouth at bedtime. (Patient not taking: Reported on 04/26/2017) 30 tablet 1 Not Taking    No Known Allergies  Review of Systems: Negative except for what is mentioned in HPI.  Physical Exam: BP 98/60   Pulse 99   Temp 97.9 F (36.6 C) (Oral)   Resp 18   Ht 5' 0.63" (1.54 m)   Wt 150 lb 4.8 oz (68.2 kg)   LMP 08/10/2016 (Approximate)   BMI 28.75 kg/m  FHR by Doppler: + bpm GENERAL: Well-developed, well-nourished female in no acute distress.  LUNGS: Clear to auscultation bilaterally.  HEART: Regular rate and rhythm. ABDOMEN: Soft, nontender, nondistended, gravid, well-healed Pfannenstiel incision. PELVIC: Deferred EXTREMITIES: Nontender, no edema, 2+ distal pulses.   Pertinent Labs/Studies:   Results for orders placed or performed during the hospital encounter of 04/28/17 (from the past 72 hour(s))  CBC     Status: Abnormal   Collection Time: 04/28/17 10:35 AM  Result Value Ref Range   WBC 10.0 4.0 - 10.5 K/uL   RBC 3.84 (L) 3.87 - 5.11 MIL/uL   Hemoglobin 9.3 (L) 12.0 - 15.0 g/dL   HCT 09.8 (L) 11.9 - 14.7 %   MCV 76.0 (L) 78.0 - 100.0 fL   MCH 24.2 (L) 26.0 - 34.0 pg   MCHC 31.8 30.0 - 36.0 g/dL   RDW 82.9 (H) 56.2 - 13.0 %   Platelets 149 (L) 150 - 400 K/uL    Comment: Performed at [facility], [address], [place], [place] [address]  RPR     Status: None   Collection Time: 04/28/17 10:35 AM  Result Value Ref Range   RPR Ser Ql Non Reactive Non Reactive    Comment: (NOTE) Performed At: [facility] [address] [place], [place] [id number] Jolene Schimke MD UX:[id number] Performed at [facility], [address], [place], [place] [address]   Type and screen [facility]     Status: None   Collection Time: 04/28/17 10:35 AM  Result Value Ref Range    ABO/RH(D) A POS    Antibody Screen NEG    Sample Expiration      05/01/2017 Performed at [facility], [address] [place], [place] [address]     Assessment and Plan :Olla Delancey is a 27 y.o. G2P1001 at [redacted]w[redacted]d being admitted being admitted for scheduled cesarean section. The risks of cesarean section  discussed with the patient included but were not limited to: bleeding which may require transfusion or reoperation; infection which may require antibiotics; injury to bowel, bladder, ureters or other surrounding organs; injury to the fetus; need for additional procedures including hysterectomy in the event of a life-threatening hemorrhage; placental abnormalities wth subsequent pregnancies, incisional problems, thromboembolic phenomenon and other postoperative/anesthesia complications. The patient concurred with the proposed plan, giving informed written consent for the procedure. Patient has been NPO since last night she will remain NPO for procedure. Anesthesia and OR aware. Preoperative prophylactic antibiotics and SCDs ordered on call to the OR. To OR when ready.   Marvene Strohm L. Harraway-Smith, M.D., Evern Core

## 2017-04-30 LAB — CBC
HCT: 20.9 % — ABNORMAL LOW (ref 36.0–46.0)
HCT: 27.9 % — ABNORMAL LOW (ref 36.0–46.0)
HEMATOCRIT: 21.1 % — AB (ref 36.0–46.0)
HEMOGLOBIN: 6.7 g/dL — AB (ref 12.0–15.0)
HEMOGLOBIN: 9 g/dL — AB (ref 12.0–15.0)
Hemoglobin: 6.5 g/dL — CL (ref 12.0–15.0)
MCH: 24.4 pg — ABNORMAL LOW (ref 26.0–34.0)
MCH: 23.5 pg — ABNORMAL LOW (ref 26.0–34.0)
MCH: 25.1 pg — ABNORMAL LOW (ref 26.0–34.0)
MCHC: 32.1 g/dL (ref 30.0–36.0)
MCHC: 30.8 g/dL (ref 30.0–36.0)
MCHC: 32.3 g/dL (ref 30.0–36.0)
MCV: 76 fL — ABNORMAL LOW (ref 78.0–100.0)
MCV: 76.2 fL — ABNORMAL LOW (ref 78.0–100.0)
MCV: 77.9 fL — ABNORMAL LOW (ref 78.0–100.0)
Platelets: 128 10*3/uL — ABNORMAL LOW (ref 150–400)
Platelets: 136 10*3/uL — ABNORMAL LOW (ref 150–400)
Platelets: 144 10*3/uL — ABNORMAL LOW (ref 150–400)
RBC: 2.75 MIL/uL — ABNORMAL LOW (ref 3.87–5.11)
RBC: 2.77 MIL/uL — AB (ref 3.87–5.11)
RBC: 3.58 MIL/uL — ABNORMAL LOW (ref 3.87–5.11)
RDW: 16.5 % — ABNORMAL HIGH (ref 11.5–15.5)
RDW: 16.3 % — AB (ref 11.5–15.5)
RDW: 15.8 % — ABNORMAL HIGH (ref 11.5–15.5)
WBC: 12.2 10*3/uL — AB (ref 4.0–10.5)
WBC: 13.2 10*3/uL — ABNORMAL HIGH (ref 4.0–10.5)
WBC: 12.8 10*3/uL — ABNORMAL HIGH (ref 4.0–10.5)

## 2017-04-30 LAB — BIRTH TISSUE RECOVERY COLLECTION (PLACENTA DONATION)

## 2017-04-30 LAB — CREATININE, SERUM: Creatinine, Ser: 0.63 mg/dL (ref 0.44–1.00)

## 2017-04-30 LAB — PREPARE RBC (CROSSMATCH)

## 2017-04-30 MED ORDER — SODIUM CHLORIDE 0.9 % IV SOLN: Freq: Once | INTRAVENOUS | Status: DC

## 2017-04-30 NOTE — Progress Notes (Signed)
Hgb of 6.7 this a.m. MD notified

## 2017-04-30 NOTE — Progress Notes (Signed)
Post Partum Day 1 Subjective: no complaints, up ad lib, voiding and tolerating PO  Objective: Blood pressure 100/62, pulse 100, temperature 98 F (36.7 C), resp. rate 18, height 5' 0.63" (1.54 m), weight 150 lb 4.8 oz (68.2 kg), last menstrual period 08/10/2016, SpO2 100 %, unknown if currently breastfeeding.  Physical Exam:  General: alert, cooperative and no distress Lochia: appropriate Uterine Fundus: firm Incision: no significant drainage, no dehiscence, no significant erythema DVT Evaluation: No evidence of DVT seen on physical exam.  Recent Labs    04/30/17 0601 04/30/17 1055  HGB 6.7* 6.5*  HCT 20.9* 21.1*    Assessment/Plan: Arabic interpreter used to discuss patient's hemoglobin results with patient and family. Patient asymptomatic and able to ambulate without difficulty. Recommended blood transfusion and discussed risks and benefits with patient. Patient agreeable to plan of care. Will transfuse 2 units PRBCs.    LOS: 1 day   Casey Obrien CNM 04/30/2017, 2:08 PM

## 2017-04-30 NOTE — Progress Notes (Signed)
Notified Dr. Alysia PennaErvin while on unit of Hgb result of 6.5 called from lab. Per Dr. Alysia PennaErvin, will likely transfuse PRBCs and is okay to still give lovenox. Will try to get live interpreter in house and notify CNM.

## 2017-04-30 NOTE — Lactation Note (Signed)
This note was copied from a baby's chart. Lactation Consultation Note  Patient Name: Casey Obrien  FOB translated. P2, Baby 26 hours old. Ex BF 8 mos. Mother states she knows how to hand express. Mom encouraged to feed baby 8-12 times/24 hours and with feeding cues.  Denies questions or concerns. Mom made aware of O/P services, breastfeeding support groups, community resources, and our phone # for post-discharge questions.        Maternal Data    Feeding Feeding Type: Breast Fed Length of feed: 45 min  LATCH Score                   Interventions    Lactation Tools Discussed/Used     Consult Status      Casey Obrien, Casey Obrien Obrien, 12:52 PM

## 2017-04-30 NOTE — Progress Notes (Signed)
Spoke with Cleone Slimaroline Neill CNM regarding interpreter. Arabic interpreter requested via [employer] but no call back as of now. [place] CitizenCaroline to come see pt and try interpreter via Ipad.

## 2017-05-01 DIAGNOSIS — D62 Acute posthemorrhagic anemia: Secondary | ICD-10-CM | POA: Diagnosis not present

## 2017-05-01 LAB — TYPE AND SCREEN
ABO/RH(D): A POS
Antibody Screen: NEGATIVE
UNIT DIVISION: 0
Unit division: 0
Unit division: 0

## 2017-05-01 LAB — BPAM RBC
BLOOD PRODUCT EXPIRATION DATE: 201904062359
BLOOD PRODUCT EXPIRATION DATE: 201904082359
Blood Product Expiration Date: 201904062359
ISSUE DATE / TIME: 201904051942
ISSUE DATE / TIME: 201904060019
ISSUE DATE / TIME: 201904051652
UNIT TYPE AND RH: 9500
Unit Type and Rh: 9500
Unit Type and Rh: 9500

## 2017-05-01 MED ORDER — POLYSACCHARIDE IRON COMPLEX 150 MG PO CAPS: 150.0000 mg | ORAL_CAPSULE | Freq: Every day | ORAL | 1 refills | Status: DC

## 2017-05-01 MED ORDER — IBUPROFEN 600 MG PO TABS: 600.0000 mg | ORAL_TABLET | Freq: Four times a day (QID) | ORAL | 0 refills | Status: DC

## 2017-05-01 MED ORDER — OXYCODONE HCL 10 MG PO TABS: 5.0000 mg | ORAL_TABLET | ORAL | 0 refills | Status: DC | PRN

## 2017-05-01 NOTE — Discharge Summary (Signed)
OB Discharge Summary     Patient Name: Casey Obrien DOB: 16-Jun-1990 MRN: [id number]   Date of admission: 04/29/2017 Delivering MD: Willodean Rosenthal   Date of discharge: 05/01/2017  Admitting diagnosis: RCS Intrauterine pregnancy: [redacted]w[redacted]d     Secondary diagnosis:  Principal Problem:   Status post repeat low transverse cesarean section Active Problems:   Supervision of other normal pregnancy, antepartum   History of C-section   Language barrier   Acute blood loss anemia  Additional problems: none     Discharge diagnosis: Term Pregnancy Delivered and Anemia                                                                                                Post partum procedures:blood transfusion  Augmentation: n/a  Complications: None  Hospital course:  Sceduled C/S   27 y.o. yo G2P2002 at [redacted]w[redacted]d was admitted to the hospital 04/29/2017 for scheduled cesarean section with the following indication:Elective Repeat.  Membrane Rupture Time/Date: 10:05 AM ,04/29/2017   Patient delivered a Viable infant.04/29/2017  Details of operation can be found in separate operative note.  Pateint had an uncomplicated postpartum course.  She is ambulating, tolerating a regular diet, passing flatus, and urinating well. Patient is discharged home in stable condition on  05/01/17         Physical exam  Vitals:   04/30/17 2211 04/30/17 2218 04/30/17 2230 05/01/17 0559  BP: 99/64 92/64 99/61  (!) 98/56  Pulse: 91 90 84 76  Resp: 20 18 18 18   Temp: 98.8 F (37.1 C) 98.7 F (37.1 C) 98.4 F (36.9 C) 98.3 F (36.8 C)  TempSrc: Oral Oral Oral Oral  SpO2: 100% 100% 100%   Weight:      Height:       General: alert, cooperative and no distress Lochia: appropriate Uterine Fundus: firm Incision: Healing well with no significant drainage, No significant erythema, Dressing is clean, dry, and intact DVT Evaluation: No evidence of DVT seen on physical exam. Negative Homan's sign. No cords or calf  tenderness. No significant calf/ankle edema. Labs: Lab Results  Component Value Date   WBC 12.8 (H) 04/30/2017   HGB 9.0 (L) 04/30/2017   HCT 27.9 (L) 04/30/2017   MCV 77.9 (L) 04/30/2017   PLT 144 (L) 04/30/2017   CMP Latest Ref Rng & Units 04/30/2017  Glucose 65 - 99 mg/dL -  BUN 6 - 20 mg/dL -  Creatinine 4.09 - 8.11 mg/dL 9.14  Sodium 782 - 956 mmol/L -  Potassium 3.5 - 5.1 mmol/L -  Chloride 101 - 111 mmol/L -  CO2 22 - 32 mmol/L -  Calcium 8.9 - 10.3 mg/dL -  Total Protein 6.5 - 8.1 g/dL -  Total Bilirubin 0.3 - 1.2 mg/dL -  Alkaline Phos 38 - 213 U/L -  AST 15 - 41 U/L -  ALT 14 - 54 U/L -    Discharge instruction: per After Visit Summary and "Baby and Me Booklet".  After visit meds:  Allergies as of 05/01/2017   No Known Allergies     Medication List    STOP  taking these medications   acetaminophen 325 MG tablet Commonly known as:  TYLENOL   Docusate Sodium 100 MG capsule   FIBER PO   folic acid 400 MCG tablet Commonly known as:  FOLVITE   lidocaine 5 % Commonly known as:  LIDODERM   pyridOXINE 100 MG tablet Commonly known as:  VITAMIN B-6   ranitidine 150 MG tablet Commonly known as:  ZANTAC     TAKE these medications   ibuprofen 600 MG tablet Commonly known as:  ADVIL,MOTRIN Take 1 tablet (600 mg total) by mouth every 6 (six) hours.   iron polysaccharides 150 MG capsule Commonly known as:  NIFEREX Take 1 capsule (150 mg total) by mouth daily.   Oxycodone HCl 10 MG Tabs Take 0.5-1 tablets (5-10 mg total) by mouth every 4 (four) hours as needed (pain scale > 7).       Diet: routine diet  Activity: Advance as tolerated. Pelvic rest for 6 weeks.   Outpatient follow up:6 weeks Follow up Appt:No future appointments. Follow up Visit:No follow-ups on file.  Postpartum contraception: Natural Family Planning  Newborn Data: Live born female  Birth Weight: 7 lb 11.3 oz (3495 g) APGAR: 9, 9  Newborn Delivery   Birth date/time:  04/29/2017  10:07:00 Delivery type:  C-Section, Low Transverse Trial of labor:  No C-section categorization:  Repeat     Baby Feeding: Breast Disposition:home with mother  Arabic interpreter present for encounter  05/01/2017 Donette LarryMelanie Alexzander Dolinger, CNM

## 2017-05-24 ENCOUNTER — Ambulatory Visit (INDEPENDENT_AMBULATORY_CARE_PROVIDER_SITE_OTHER): Payer: Medicaid Other | Admitting: General Practice

## 2017-05-24 VITALS — BP 100/60 | HR 68 | Ht 60.63 in | Wt 135.0 lb

## 2017-05-24 DIAGNOSIS — Z5189 Encounter for other specified aftercare: Secondary | ICD-10-CM

## 2017-05-24 DIAGNOSIS — K649 Unspecified hemorrhoids: Secondary | ICD-10-CM

## 2017-05-24 DIAGNOSIS — K5900 Constipation, unspecified: Secondary | ICD-10-CM

## 2017-05-24 MED ORDER — DOCUSATE SODIUM 100 MG PO CAPS
100.0000 mg | ORAL_CAPSULE | Freq: Two times a day (BID) | ORAL | 1 refills | Status: DC
Start: 2017-05-24 — End: 2017-06-11

## 2017-05-24 MED ORDER — HYDROCORTISONE 2.5 % RE CREA: 1.0000 "application " | TOPICAL_CREAM | Freq: Two times a day (BID) | RECTAL | 0 refills | Status: DC

## 2017-05-24 NOTE — Progress Notes (Signed)
I have reviewed this chart and agree with the RN/CMA assessment and management.    Luverne Farone C Tristine Langi, MD, FACOG Attending Physician, [facility]  

## 2017-05-24 NOTE — Progress Notes (Signed)
Kathrynn Ducking used for interpreter for today's visit. Patient here for wound check today. Delivered 4/4 via repeat c-section. Incision is clean, dry & intact. Steri strips were still present so I removed them. Reviewed wound care & signs and symptoms of infection. Patient verbalized understanding and reports problems with hemorrhoids causing her pain/bleeding and off/on constipation. Reviewed dietary recommendations & over the counter medications. Told patient we would send in a Rx for stool softeners & something for hemorrhoids. Patient verbalized understanding & had no other questions.

## 2017-06-07 ENCOUNTER — Ambulatory Visit: Payer: Medicaid Other | Admitting: Family Medicine

## 2017-06-11 ENCOUNTER — Encounter: Payer: Self-pay | Admitting: Family Medicine

## 2017-06-11 ENCOUNTER — Ambulatory Visit (INDEPENDENT_AMBULATORY_CARE_PROVIDER_SITE_OTHER): Payer: Medicaid Other | Admitting: Family Medicine

## 2017-06-11 DIAGNOSIS — Z3046 Encounter for surveillance of implantable subdermal contraceptive: Secondary | ICD-10-CM

## 2017-06-11 DIAGNOSIS — K649 Unspecified hemorrhoids: Secondary | ICD-10-CM | POA: Diagnosis not present

## 2017-06-11 DIAGNOSIS — Z975 Presence of (intrauterine) contraceptive device: Secondary | ICD-10-CM | POA: Insufficient documentation

## 2017-06-11 DIAGNOSIS — K5900 Constipation, unspecified: Secondary | ICD-10-CM

## 2017-06-11 DIAGNOSIS — Z1389 Encounter for screening for other disorder: Secondary | ICD-10-CM | POA: Diagnosis not present

## 2017-06-11 DIAGNOSIS — Z30017 Encounter for initial prescription of implantable subdermal contraceptive: Secondary | ICD-10-CM

## 2017-06-11 MED ORDER — DOCUSATE SODIUM 100 MG PO CAPS: 100.0000 mg | ORAL_CAPSULE | Freq: Two times a day (BID) | ORAL | 1 refills | Status: DC

## 2017-06-11 MED ORDER — POLYETHYLENE GLYCOL 3350 17 GM/SCOOP PO POWD: 14.0000 g | Freq: Every day | ORAL | 0 refills | Status: DC

## 2017-06-11 MED ORDER — HYDROCORTISONE 2.5 % RE CREA: 1.0000 "application " | TOPICAL_CREAM | Freq: Two times a day (BID) | RECTAL | 0 refills | Status: DC

## 2017-06-11 NOTE — Progress Notes (Signed)
Procedure Note: Nexplanon insertion  Patient is a 27 y.o. G2P2002 now 5 weeks postpartum. She desires long-term reversible contraception.  Risks/benefits/side effects of Nexplanon have been discussed with patient and her questions have been answered. Patient is aware of the common side effect of irregular bleeding, which the incidence of decreases over time.  BP 106/62   Pulse 84   Ht  (1.6 m)   Wt 137 lb 6.4 oz (62.3 kg)   LMP 08/10/2016 (Approximate)   Breastfeeding? Yes Comment: Breast & Bottle  BMI 24.34 kg/m   Negative UPT  She is left-handed, so her right arm, approximately 10 cm proximal from the elbow, was cleansed with alcohol and anesthetized with 2 mL of 1% Lidocaine.  The area was cleansed again with betadine and the Nexplanon was inserted per manufacturer's recommendations without difficulty.  A steri-strip and pressure bandage were applied.  Pt was instructed to keep the area clean and dry, remove pressure bandage in 24 hours, and keep insertion site covered with the steri-strip for 3-5 days.  She was given a card indicating date Nexplanon was inserted and date it needs to be removed. Follow-up PRN problems.  Lot # [id number]  Kandra Nicolas Sonoma Firkus  06/11/2017 10:13 AM

## 2017-06-11 NOTE — Progress Notes (Signed)
Subjective:     Casey Obrien is a 27 y.o. female who presents for a postpartum visit. She is 6 weeks postpartum following a low cervical transverse Cesarean section. I have fully reviewed the prenatal and intrapartum course. The delivery was at 39/3 gestational weeks. Outcome: repeat cesarean section, low transverse incision. Anesthesia: spinal. Postpartum course has been uncomplicated. Baby's course has been uncomplicated. Baby is feeding by both breast and bottle - Similac Advance. Bleeding no bleeding. Bowel function is abnormal: Constipation. Bladder function is normal. Patient is sexually active. Contraception method is condoms. Postpartum depression screening: negative.  The following portions of the patient's history were reviewed and updated as appropriate: allergies, current medications, past family history, past medical history, past social history, past surgical history and problem list.  Review of Systems Pertinent items are noted in HPI.   Objective:    BP 106/62   Pulse 84   Ht  (1.6 m)   Wt 137 lb 6.4 oz (62.3 kg)   LMP 08/10/2016 (Approximate)   Breastfeeding? Yes Comment: Breast & Bottle  BMI 24.34 kg/m    General:  Well-appearing female in NAD   HENT:  [place]/AT, normal oral mucosa  Eyes: Normal conjunctivae, no scleral icterus  Lungs:  Normal respiratory effort  Heart: Normal rate  Abdomen: Soft, ND, ND, +BS  Skin : Warm, dry. LTCS incision well-healed, clean, dry, w/o erythema or drainage  MSK: No LE edema  Psych: Normal mood, appropriate affect  Neuro:  Alert and oriented x3         Assessment:     Normal postpartum exam. Pap smear not done at today's visit.    Plan:    1. Contraception: Nexplanon, inserted today, see procedure note 2. Constipation: bowel regimen prescribed, advised to increased water intake 3. Follow up in: 1 year or as needed.    Raynelle Fanning P. Degele, MD OB Fellow 06/11/2017

## 2017-06-11 NOTE — Patient Instructions (Signed)
Nexplanon Instructions After Insertion  Keep bandage clean and dry for 24 hours  May use ice/Tylenol/Ibuprofen for soreness or pain  If you develop fever, drainage or increased warmth from incision site-contact office immediately   

## 2017-06-11 NOTE — Progress Notes (Signed)
Pt states having severe constipation & would like to get Nexplanon.

## 2017-06-22 ENCOUNTER — Encounter: Payer: Self-pay | Admitting: *Deleted

## 2018-01-25 ENCOUNTER — Encounter: Payer: Self-pay | Admitting: Family Medicine

## 2018-01-25 ENCOUNTER — Ambulatory Visit (INDEPENDENT_AMBULATORY_CARE_PROVIDER_SITE_OTHER): Payer: Medicaid Other | Admitting: Family Medicine

## 2018-01-25 VITALS — BP 110/65 | HR 76 | Ht 62.0 in | Wt 164.5 lb

## 2018-01-25 DIAGNOSIS — Z3046 Encounter for surveillance of implantable subdermal contraceptive: Secondary | ICD-10-CM | POA: Diagnosis present

## 2018-01-25 DIAGNOSIS — R635 Abnormal weight gain: Secondary | ICD-10-CM | POA: Diagnosis not present

## 2018-01-25 DIAGNOSIS — D649 Anemia, unspecified: Secondary | ICD-10-CM

## 2018-01-25 DIAGNOSIS — Z3002 Counseling and instruction in natural family planning to avoid pregnancy: Secondary | ICD-10-CM

## 2018-01-25 NOTE — Progress Notes (Signed)
Subjective:    Wynne DustSeba Moradi - 27 y.o. female MRN [id number]  Date of birth: 02/07/1990  HPI  Wynne DustSeba Reichenberger is a 27 y.o. 402P2002 female here for nexplanon removal. States that she's had a lot of spotting, weight gain and fatigue since nexplanon placed. On chart review, patient has gained ~30lbs since May 2019 when nexplanon was placed. She was anemic during pregnancy and required blood transfusion at delivery. She has not been taking prenatal vitamins or iron to treat this. She denies a history of thyroid dysfunction. She has not had repeat labs since that time. She will be using natural family planning and condoms as her birth control method.    OB History    Gravida  2   Para  2   Term  2   Preterm      AB      Living  2     SAB      TAB      Ectopic      Multiple  0   Live Births  2             Health Maintenance:   Health Maintenance Due  Topic Date Due  . PAP-Cervical Cytology Screening  02/02/2011  . PAP SMEAR-Modifier  02/02/2011  . INFLUENZA VACCINE  08/26/2017    -  reports that she has never smoked. She has never used smokeless tobacco. - Review of Systems: Per HPI. - Past Medical History: Patient Active Problem List   Diagnosis Date Noted  . Nexplanon in place 06/11/2017  . Acute blood loss anemia 05/01/2017  . Status post repeat low transverse cesarean section 04/29/2017  . Constipation 04/08/2017  . Carpal tunnel syndrome during pregnancy 03/23/2017  . Language barrier 02/23/2017  . History of C-section 12/03/2016   - Medications: reviewed and updated   Objective:   Physical Exam There were no vitals taken for this visit. Gen: NAD, alert, cooperative with exam, well-appearing HEENT: NCAT, PERRL, clear conjunctiva CV: RRR  Resp: non-labored Skin: no rashes, normal turgor  Neuro: no gross deficits.  Psych: good insight, alert and oriented GU/GYN: Deferred    Assessment & Plan:   1. Encounter for Nexplanon removal GYNECOLOGY OFFICE  PROCEDURE NOTE Nexplanon Removal Patient identified, informed consent performed, consent signed.   Appropriate time out taken. Nexplanon site identified.  Area prepped in usual sterile fashon. One ml of 1% lidocaine was used to anesthetize the area at the distal end of the implant. A small stab incision was made right beside the implant on the distal portion.  The Nexplanon rod was not able to be grasped due to deep location of the rod.  Dr. Marice Potterove was called in to assist, who created a second incision at the proximal end of the implant. The implant was then grasped and removed without difficulty.  There was minimal blood loss. There were no complications.  Steri-strips were applied over the small incisions.  A pressure bandage was applied to reduce any bruising.  The patient tolerated the procedure well and was given post procedure instructions.  Patient is planning to use NFP and condoms for contraception/attempt conception.  2. Abnormal weight gain - TSH - Hemoglobin A1c - CBC  3. Encounter for counseling and instruction in natural family planning to avoid pregnancy - instructions and condoms provided  4. Anemia, unspecified type - repeat CBC today given fatigue   Routine preventative health maintenance measures emphasized. Please refer to After Visit Summary for other counseling recommendations.  No follow-ups on file.  Gwenevere AbbotNimeka Ori Kreiter, MD  OB Fellow  01/25/2018, 10:58 AM

## 2018-01-26 LAB — CBC
HEMATOCRIT: 39.5 % (ref 34.0–46.6)
HEMOGLOBIN: 13 g/dL (ref 11.1–15.9)
MCH: 26.2 pg — AB (ref 26.6–33.0)
MCHC: 32.9 g/dL (ref 31.5–35.7)
MCV: 80 fL (ref 79–97)
Platelets: 216 10*3/uL (ref 150–450)
RBC: 4.97 x10E6/uL (ref 3.77–5.28)
RDW: 13.5 % (ref 12.3–15.4)
WBC: 8 10*3/uL (ref 3.4–10.8)

## 2018-01-26 LAB — TSH: TSH: 8.18 u[IU]/mL — ABNORMAL HIGH (ref 0.450–4.500)

## 2018-01-26 LAB — HEMOGLOBIN A1C
ESTIMATED AVERAGE GLUCOSE: 108 mg/dL
Hgb A1c MFr Bld: 5.4 % (ref 4.8–5.6)

## 2018-01-28 ENCOUNTER — Other Ambulatory Visit: Payer: Self-pay | Admitting: Family Medicine

## 2018-01-28 ENCOUNTER — Telehealth: Payer: Self-pay

## 2018-01-28 DIAGNOSIS — R7989 Other specified abnormal findings of blood chemistry: Secondary | ICD-10-CM

## 2018-01-28 NOTE — Progress Notes (Signed)
Casey Obrien presented with weight gain and fatigue, which she attributed to her nexplanon and asked that it be removed. However, TSH was elevated. Will obtain additional labs prior to starting treatment. Patient should follow up with PCP as well.

## 2018-01-28 NOTE — Telephone Encounter (Signed)
Called pt to advise of TSH results using Arabic interpreter id# [phone] from [employer].No answer & pt does not have mail box set up. Will try again later.

## 2018-01-28 NOTE — Telephone Encounter (Signed)
-----   Message from Gwenevere Abbot, MD sent at 01/28/2018 12:55 AM EST ----- Regarding: Elevated TSH Please call patient using Arabic interpreter to let her know that her TSH was elevated as I suspected. This means that she has hypothyroidism. It is likely the cause for her fatigue and weight gain. She will need to get additional labs drawn, which have already been ordered, and then start treatment for her thyroid problem. If she does not have a primary care doctor, she will need to get one soon to help manage this problem. Please let me know if she has any additional questions. Thanks!

## 2018-01-31 ENCOUNTER — Telehealth: Payer: Self-pay

## 2018-01-31 NOTE — Telephone Encounter (Signed)
Called pt again today using [employer] Arabic Interpreter id# [phone], still no answer,no VM setup. So will send Letter of results & follow up labs needed.Letter sent Certified mail.

## 2018-02-01 ENCOUNTER — Encounter: Payer: Self-pay | Admitting: *Deleted

## 2018-02-16 ENCOUNTER — Other Ambulatory Visit: Payer: Medicaid Other

## 2018-02-16 DIAGNOSIS — R7989 Other specified abnormal findings of blood chemistry: Secondary | ICD-10-CM

## 2018-02-17 LAB — T4, FREE: FREE T4: 1.08 ng/dL (ref 0.82–1.77)

## 2018-02-17 LAB — T3: T3, Total: 116 ng/dL (ref 71–180)

## 2018-02-28 ENCOUNTER — Other Ambulatory Visit: Payer: Self-pay | Admitting: General Practice

## 2018-02-28 DIAGNOSIS — E039 Hypothyroidism, unspecified: Secondary | ICD-10-CM

## 2018-03-28 ENCOUNTER — Ambulatory Visit (INDEPENDENT_AMBULATORY_CARE_PROVIDER_SITE_OTHER): Payer: Self-pay | Admitting: Family Medicine

## 2018-03-28 ENCOUNTER — Encounter: Payer: Self-pay | Admitting: Family Medicine

## 2018-03-28 ENCOUNTER — Telehealth: Payer: Self-pay | Admitting: Family Medicine

## 2018-03-28 VITALS — BP 120/73 | HR 78 | Resp 17 | Ht 60.0 in | Wt 154.4 lb

## 2018-03-28 DIAGNOSIS — R5383 Other fatigue: Secondary | ICD-10-CM

## 2018-03-28 DIAGNOSIS — Z1389 Encounter for screening for other disorder: Secondary | ICD-10-CM

## 2018-03-28 DIAGNOSIS — Z3202 Encounter for pregnancy test, result negative: Secondary | ICD-10-CM

## 2018-03-28 DIAGNOSIS — Z7689 Persons encountering health services in other specified circumstances: Secondary | ICD-10-CM

## 2018-03-28 DIAGNOSIS — Z308 Encounter for other contraceptive management: Secondary | ICD-10-CM

## 2018-03-28 DIAGNOSIS — R635 Abnormal weight gain: Secondary | ICD-10-CM

## 2018-03-28 DIAGNOSIS — R6889 Other general symptoms and signs: Secondary | ICD-10-CM

## 2018-03-28 DIAGNOSIS — R7989 Other specified abnormal findings of blood chemistry: Secondary | ICD-10-CM

## 2018-03-28 LAB — POCT URINE PREGNANCY: Preg Test, Ur: NEGATIVE

## 2018-03-28 NOTE — Telephone Encounter (Signed)
Please contact patient using interpreter service to advise per located EMR she is established with [facility] and can schedule an appointment with their office for IUD or Nexplanon placement. Also please schedule a 8 week follow-up for patient to be seen back for thyroid function evaluation

## 2018-03-28 NOTE — Patient Instructions (Addendum)
Thank you for choosing [facility] Care at [facility] to be your medical home!    Casey Obrien was seen by Joaquin Courts, FNP today.   Casey Tay's primary care provider is Bing Neighbors, FNP.   For the best care possible, you should try to see Joaquin Courts, FNP-C whenever you come to the clinic.   We look forward to seeing you again soon!  If you have any questions about your visit today, please call us at [phone] or feel free to reach your primary care provider via MyChart.      Thyroid-Stimulating Hormone Test Why am I having this test? You may have a thyroid-stimulating hormone (TSH) test if you have possible symptoms of abnormal thyroid hormone levels. This test can help your health care provider:  Diagnose a disorder of the thyroid gland or pituitary gland.  Manage your condition and treatment if you have an underactive thyroid (hypothyroidism) or an overactive thyroid (hyperthyroidism). Newborn babies may have this test done to screen for hypothyroidism that is present at birth (congenital). The thyroid is a gland in the lower front of the neck. It makes hormones that affect many body parts and systems, including the system that affects how quickly the body burns fuel for energy (metabolism). The pituitary gland is located just below the brain, behind the eyes and nasal passages. It helps maintain thyroid hormone levels and thyroid gland function. What is being tested? This test measures the amount of TSH in your blood. TSH may also be called thyrotropin. When the thyroid does not make enough hormones, the pituitary gland releases TSH into the bloodstream to stimulate the thyroid gland to make more hormones. What kind of sample is taken?     A blood sample is required for this test. It is usually collected by inserting a needle into a blood vessel. For newborns, a small amount of blood may be collected from the umbilical cord, or by using a small needle to prick the  baby's heel (heel stick). Tell a health care provider about:  All medicines you are taking, including vitamins, herbs, eye drops, creams, and over-the-counter medicines.  Any blood disorders you have.  Any surgeries you have had.  Any medical conditions you have.  Whether you are pregnant or may be pregnant. How are the results reported? Your test results will be reported as a value that indicates how much TSH is in your blood. Your health care provider will compare your results to normal ranges that were established after testing a large group of people (reference ranges). Reference ranges may vary among labs and hospitals. For this test, common reference ranges are:  Adult: 2-10 microunits/mL or 2-10 milliunits/L.  Newborn: ? Heel stick: 3-18 microunits/mL or 3-18 milliunits/L. ? Umbilical cord: 3-12 microunits/mL or 3-12 milliunits/L. What do the results mean? Results that are within the reference range are considered normal. This means that you have a normal amount of TSH in your blood. Results that are higher than the reference range mean that your TSH levels are too high. This may mean:  Your thyroid gland is not making enough thyroid hormones.  Your thyroid medicine dosage is too low.  You have a tumor on your pituitary gland. This is rare. Results that are lower than the reference range mean that your TSH levels are too low. This may be caused by hyperthyroidism or by a problem with the pituitary gland function. Talk with your health care provider about what your results mean. Questions to  ask your health care provider Ask your health care provider, or the department that is doing the test:  When will my results be ready?  How will I get my results?  What are my treatment options?  What other tests do I need?  What are my next steps? Summary  You may have a thyroid-stimulating hormone (TSH) test if you have possible symptoms of abnormal thyroid hormone  levels.  The thyroid is a gland in the lower front of the neck. It makes hormones that affect many body parts and systems.  The pituitary gland is located just below the brain, behind the eyes and nasal passages. It helps maintain thyroid hormone levels and thyroid gland function.  This test measures the amount of TSH in your blood. TSH is made by the pituitary gland. It may also be called thyrotropin. This information is not intended to replace advice given to you by your health care provider. Make sure you discuss any questions you have with your health care provider. Document Released: 02/07/2004 Document Revised: 09/15/2016 Document Reviewed: 09/15/2016 Elsevier Interactive Patient Education  2019 [employer].

## 2018-03-28 NOTE — Telephone Encounter (Signed)
Left voice mail to call back 

## 2018-03-28 NOTE — Progress Notes (Signed)
Casey Obrien, is a 28 y.o. female  OPF:[id number]  NOT:[id number]  DOB - [birth year]  CC:  Chief Complaint  Patient presents with  . Establish Care       HPI: Casey Obrien is a 28 y.o. female is here today to establish care.   Clinical research associate used.    Patient presents today with a concern of possible thyroid disease. Patient reports that she was seen at a clinic and was advised that her thyroid levels were abnormal. In further review and verification of patient's name, it appears patient has two charts due to missspelling of first name. Patient verified spelling of name here in office today as Casey Obrien. Review EMR and name is spelled at [facility]. Cross reference MRN [id number]. Requesting a merger of charts. Last TSH per cross referenced chart >8 back in December. She is currently not taking any medication for thyroid disease. She endorses hair thinning, fatigue, and temperature intolerance.  Patient is new today therefore unknown previous body weight. Patient reports she has gained approximately 40 lbs. Current Body mass index is 30.15 kg/m. She is interested in contraceptive management with either an IUD or nexplanon. Will defer to OBGYN to schedule placement as patient is already established with [facility].  Current medications:No current outpatient medications on file.   Pertinent family medical history: family history includes Diabetes in her father; Heart disease in her father; Hypertension in her father and mother.   No Known Allergies  Social History   Socioeconomic History  . Marital status: Married    Spouse name: Not on file  . Number of children: Not on file  . Years of education: Not on file  . Highest education level: Not on file  Occupational History  . Not on file  Social Needs  . Financial resource strain: Not on file  . Food insecurity:    Worry: Not on file    Inability: Not on file  . Transportation needs:    Medical: Not on file   Non-medical: Not on file  Tobacco Use  . Smoking status: Never Smoker  . Smokeless tobacco: Never Used  Substance and Sexual Activity  . Alcohol use: Never    Frequency: Never  . Drug use: Never  . Sexual activity: Yes    Partners: Male  Lifestyle  . Physical activity:    Days per week: Not on file    Minutes per session: Not on file  . Stress: Not on file  Relationships  . Social connections:    Talks on phone: Not on file    Gets together: Not on file    Attends religious service: Not on file    Active member of club or organization: Not on file    Attends meetings of clubs or organizations: Not on file    Relationship status: Not on file  . Intimate partner violence:    Fear of current or ex partner: Not on file    Emotionally abused: Not on file    Physically abused: Not on file    Forced sexual activity: Not on file  Other Topics Concern  . Not on file  Social History Narrative  . Not on file    Review of Systems: Pertinent negatives listed in HPI  Objective:   Vitals:   03/28/18 0903  BP: 120/73  Pulse: 78  Resp: 17  SpO2: 98%    BP Readings from Last 3 Encounters:  03/28/18 120/73    [employer]  03/28/18 0903  Weight: 154 lb 6.4 oz (70 kg)      Physical Exam: Constitutional: Patient appears well-developed and well-nourished. No distress. HENT: Normocephalic, atraumatic, External right and left ear normal. Oropharynx is clear and moist.  Eyes: Conjunctivae and EOM are normal. PERRLA, no scleral icterus. Neck: Normal ROM. Neck supple. No JVD. No tracheal deviation. No thyromegaly. CVS: RRR, S1/S2 +, no murmurs, no gallops, no carotid bruit.  Pulmonary: Effort and breath sounds normal, no stridor, rhonchi, wheezes, rales.  Abdominal: Soft. BS +, no distension, tenderness, rebound or guarding.  Musculoskeletal: Normal range of motion. No edema and no tenderness.  Neuro: Alert. Normal muscle tone coordination. Normal gait.  Skin: Skin is warm  and dry. No rash noted. Not diaphoretic. No erythema. No pallor. Psychiatric: Normal mood and affect. Behavior, judgment, thought content normal.     Assessment and plan:  1. Encounter to establish care  2. Screening for blood or protein in urine - POCT urine pregnancy  3. Fatigue, unspecified type - Thyroid Panel With TSH - Comprehensive metabolic panel  4. Temperature intolerance - Thyroid Panel With TSH  5. Abnormal thyroid blood test - Thyroid Panel With TSH - Comprehensive metabolic panel   TSH level elevated > 8 in December 2019. Repeating today. If remains elevated, will start levothyroxine.    Joaquin Courts, FNP Primary Care at [facility] [address], [place] [address] [phone]: [phone]     The patient was given clear instructions to go to ER or return to medical center if symptoms don't improve, worsen or new problems develop. The patient verbalized understanding. The patient was advised  to call and obtain lab results if they haven't heard anything from out office within 7-10 business days.    This note has been created with Education officer, environmental. Any transcriptional errors are unintentional.

## 2018-03-29 LAB — CBC WITH DIFFERENTIAL/PLATELET
BASOS: 0 %
Basophils Absolute: 0 10*3/uL (ref 0.0–0.2)
EOS (ABSOLUTE): 0.1 10*3/uL (ref 0.0–0.4)
Eos: 2 %
Hematocrit: 37.1 % (ref 34.0–46.6)
Hemoglobin: 12.1 g/dL (ref 11.1–15.9)
IMMATURE GRANS (ABS): 0 10*3/uL (ref 0.0–0.1)
Immature Granulocytes: 0 %
Lymphocytes Absolute: 2.5 10*3/uL (ref 0.7–3.1)
Lymphs: 44 %
MCH: 26.9 pg (ref 26.6–33.0)
MCHC: 32.6 g/dL (ref 31.5–35.7)
MCV: 82 fL (ref 79–97)
Monocytes Absolute: 0.4 10*3/uL (ref 0.1–0.9)
Monocytes: 7 %
Neutrophils Absolute: 2.7 10*3/uL (ref 1.4–7.0)
Neutrophils: 47 %
Platelets: 188 10*3/uL (ref 150–450)
RBC: 4.5 x10E6/uL (ref 3.77–5.28)
RDW: 13.8 % (ref 11.7–15.4)
WBC: 5.6 10*3/uL (ref 3.4–10.8)

## 2018-03-29 LAB — THYROID PANEL WITH TSH
FREE THYROXINE INDEX: 1.9 (ref 1.2–4.9)
T3 Uptake Ratio: 25 % (ref 24–39)
T4, Total: 7.4 ug/dL (ref 4.5–12.0)
TSH: 4.46 u[IU]/mL (ref 0.450–4.500)

## 2018-03-29 LAB — COMPREHENSIVE METABOLIC PANEL
A/G RATIO: 2.3 — AB (ref 1.2–2.2)
ALT: 8 IU/L (ref 0–32)
AST: 11 IU/L (ref 0–40)
Albumin: 4.3 g/dL (ref 3.9–5.0)
Alkaline Phosphatase: 53 IU/L (ref 39–117)
BUN/Creatinine Ratio: 24 — ABNORMAL HIGH (ref 9–23)
BUN: 21 mg/dL — ABNORMAL HIGH (ref 6–20)
Bilirubin Total: 0.5 mg/dL (ref 0.0–1.2)
CO2: 20 mmol/L (ref 20–29)
Calcium: 8.9 mg/dL (ref 8.7–10.2)
Chloride: 103 mmol/L (ref 96–106)
Creatinine, Ser: 0.86 mg/dL (ref 0.57–1.00)
GFR calc Af Amer: 106 mL/min/{1.73_m2} (ref >59)
GFR, EST NON AFRICAN AMERICAN: 92 mL/min/{1.73_m2} (ref >59)
Globulin, Total: 1.9 g/dL (ref 1.5–4.5)
Glucose: 96 mg/dL (ref 65–99)
Potassium: 3.6 mmol/L (ref 3.5–5.2)
Sodium: 139 mmol/L (ref 134–144)
Total Protein: 6.2 g/dL (ref 6.0–8.5)

## 2018-03-29 NOTE — Telephone Encounter (Signed)
Left voice mail to call back 

## 2018-04-08 NOTE — Telephone Encounter (Signed)
Notes recorded by Heidi Dach, CMA on 04/08/2018 at 3:49 PM EDT Left voicemail to call back.

## 2018-04-14 ENCOUNTER — Telehealth: Payer: Self-pay | Admitting: *Deleted

## 2018-04-14 NOTE — Telephone Encounter (Signed)
Left message on voicemail with assistance of Arabic Cloyd Stagers- [phone]

## 2018-04-14 NOTE — Telephone Encounter (Signed)
Notes recorded by Guy Franco, RN on 04/14/2018 at 6:04 PM EDT Left message to return call with [facility] interpreter: Samson [phone] ------  Notes recorded by Heidi Dach, CMA on 04/08/2018 at 3:49 PM EDT Left voicemail to call back. ------  Notes recorded by Heidi Dach, CMA on 03/29/2018 at 1:49 PM EST Left voicemail to call back. ------  Notes recorded by Bing Neighbors, FNP on 03/29/2018 at 12:51 PM EST Contact patient to notify recent thyroid studies are within normal range, although on the high normal range Will repeat in 6 weeks. Blood studies and, electrolytes, kidney and liver function remain normal. I recommend engaging in routine physical activity with a goal of 150 minutes per week to improve energy level and this will assist in reducing weight.

## 2018-04-20 NOTE — Progress Notes (Signed)
"  unable to contact" letter mailed.

## 2018-04-26 ENCOUNTER — Encounter: Payer: Self-pay | Admitting: Family Medicine

## 2018-04-26 ENCOUNTER — Ambulatory Visit: Payer: Self-pay | Admitting: Family Medicine

## 2018-04-27 ENCOUNTER — Encounter: Payer: Self-pay | Admitting: Family Medicine

## 2018-06-15 DIAGNOSIS — Z30430 Encounter for insertion of intrauterine contraceptive device: Secondary | ICD-10-CM | POA: Insufficient documentation

## 2019-02-21 ENCOUNTER — Encounter (HOSPITAL_COMMUNITY): Payer: Self-pay | Admitting: Emergency Medicine

## 2019-02-21 ENCOUNTER — Ambulatory Visit (HOSPITAL_COMMUNITY)
Admission: EM | Admit: 2019-02-21 | Discharge: 2019-02-21 | Disposition: A | Discharge status: Home / Self Care | Payer: HRSA Program | Attending: Internal Medicine | Admitting: Internal Medicine

## 2019-02-21 ENCOUNTER — Other Ambulatory Visit: Payer: Self-pay

## 2019-02-21 DIAGNOSIS — J029 Acute pharyngitis, unspecified: Secondary | ICD-10-CM | POA: Insufficient documentation

## 2019-02-21 DIAGNOSIS — U071 COVID-19: Secondary | ICD-10-CM | POA: Insufficient documentation

## 2019-02-21 DIAGNOSIS — R05 Cough: Secondary | ICD-10-CM | POA: Insufficient documentation

## 2019-02-21 DIAGNOSIS — R197 Diarrhea, unspecified: Secondary | ICD-10-CM | POA: Insufficient documentation

## 2019-02-21 DIAGNOSIS — R52 Pain, unspecified: Secondary | ICD-10-CM | POA: Insufficient documentation

## 2019-02-21 DIAGNOSIS — R059 Cough, unspecified: Secondary | ICD-10-CM

## 2019-02-21 DIAGNOSIS — R000 Tachycardia, unspecified: Secondary | ICD-10-CM | POA: Diagnosis not present

## 2019-02-21 LAB — POCT RAPID STREP A: Streptococcus, Group A Screen (Direct): NEGATIVE

## 2019-02-21 LAB — POC SARS CORONAVIRUS 2 AG: SARS Coronavirus 2 Ag: POSITIVE — AB

## 2019-02-21 LAB — POC SARS CORONAVIRUS 2 AG -  ED: SARS Coronavirus 2 Ag: POSITIVE — AB

## 2019-02-21 MED ORDER — BENZONATATE 100 MG PO CAPS: 100.0000 mg | ORAL_CAPSULE | Freq: Three times a day (TID) | ORAL | 0 refills | Status: DC | PRN

## 2019-02-21 MED ORDER — PROMETHAZINE-DM 6.25-15 MG/5ML PO SYRP: 5.0000 mL | ORAL_SOLUTION | Freq: Every evening | ORAL | 0 refills | Status: DC | PRN

## 2019-02-21 NOTE — Discharge Instructions (Addendum)
For sore throat or cough try using a honey-based tea. Use 3 teaspoons of honey with juice squeezed from half lemon. Place shaved pieces of ginger into 1/2-1 cup of water and warm over stove top. Then mix the ingredients and repeat every 4 hours as needed. Please take Tylenol 500mg every 6 hours. Hydrate very well with at least 2 liters of water. Eat light meals such as soups to replenish electrolytes and soft fruits, veggies. Start an antihistamine like Zyrtec (cetirizine) at 10mg daily for postnasal drainage, sinus congestion.  You can take this together with pseudoephedrine (Sudafed) at a dose of 60 mg 3 times a day or twice daily as needed for the same kind of congestion.   

## 2019-02-21 NOTE — ED Provider Notes (Signed)
[place]   MRN: [id number] DOB: 11-Apr-1990  Subjective:   Casey Obrien is a 29 y.o. female presenting for 3 to 4-day history of acute onset moderate malaise including mild cough, throat pain, moderate to severe body aches, fever.  Denies any known COVID-19 contacts.  Denies taking chronic medications.   No Known Allergies  Past Medical History:  Diagnosis Date  . Medical history non-contributory      Past Surgical History:  Procedure Laterality Date  . APPENDECTOMY    . CESAREAN SECTION     cervix not dilating   . CESAREAN SECTION N/A 04/29/2017   Procedure: REPEAT CESAREAN SECTION;  Surgeon: Lavonia Drafts, MD;  Location: [place];  Service: Obstetrics;  Laterality: N/A;  . CESAREAN SECTION    . TONSILLECTOMY      Family History  Problem Relation Age of Onset  . Hypertension Mother   . Diabetes Father   . Hypertension Father   . Heart disease Father   . Cancer Neg Hx   . Stroke Neg Hx     Social History   Tobacco Use  . Smoking status: Never Smoker  . Smokeless tobacco: Never Used  Substance Use Topics  . Alcohol use: Never  . Drug use: Never    Review of Systems  Constitutional: Positive for fever and malaise/fatigue.  HENT: Positive for sore throat. Negative for congestion, ear pain and sinus pain.   Eyes: Negative for discharge and redness.  Respiratory: Positive for cough. Negative for hemoptysis, shortness of breath and wheezing.   Cardiovascular: Negative for chest pain.  Gastrointestinal: Negative for abdominal pain, diarrhea, nausea and vomiting.  Genitourinary: Negative for dysuria, flank pain and hematuria.  Musculoskeletal: Positive for myalgias.  Skin: Negative for rash.  Neurological: Negative for dizziness, weakness and headaches.  Psychiatric/Behavioral: Negative for depression and substance abuse.     Objective:   Vitals: BP 109/71 (BP Location: Left Arm)   Pulse (!) 101   Temp 99.7 F (37.6 C)  (Oral)   Resp (!) 22   SpO2 98%   Physical Exam Constitutional:      General: She is not in acute distress.    Appearance: Normal appearance. She is well-developed. She is not ill-appearing, toxic-appearing or diaphoretic.  HENT:     Head: Normocephalic and atraumatic.     Nose: Nose normal.     Mouth/Throat:     Mouth: Mucous membranes are moist.  Eyes:     Extraocular Movements: Extraocular movements intact.     Pupils: Pupils are equal, round, and reactive to light.  Cardiovascular:     Rate and Rhythm: Regular rhythm. Tachycardia present.     Pulses: Normal pulses.     Heart sounds: Normal heart sounds. No murmur. No friction rub. No gallop.   Pulmonary:     Effort: Pulmonary effort is normal. No respiratory distress.     Breath sounds: Normal breath sounds. No stridor. No wheezing, rhonchi or rales.  Skin:    General: Skin is warm and dry.     Findings: No rash.  Neurological:     Mental Status: She is alert and oriented to person, place, and time.  Psychiatric:        Mood and Affect: Mood normal.        Behavior: Behavior normal.        Thought Content: Thought content normal.     Results for orders placed or performed during the hospital encounter of 02/21/19 (from the  past 24 hour(s))  POC SARS Coronavirus 2 Ag-ED - Nasal Swab (BD Veritor Kit)     Status: Abnormal   Collection Time: 02/21/19  3:43 PM  Result Value Ref Range   SARS Coronavirus 2 Ag POSITIVE (A) NEGATIVE  POC SARS Coronavirus 2 Ag     Status: Abnormal   Collection Time: 02/21/19  3:43 PM  Result Value Ref Range   SARS Coronavirus 2 Ag POSITIVE (A) NEGATIVE  POCT rapid strep A [facility])     Status: None   Collection Time: 02/21/19  3:45 PM  Result Value Ref Range   Streptococcus, Group A Screen (Direct) NEGATIVE NEGATIVE    Assessment and Plan :   1. COVID-19 virus infection   2. Cough   3. Sore throat   4. Body aches   5. Diarrhea, unspecified type     Tachycardia likely  related to her COVID-19 infection.  Strep culture pending. Will manage COVID-19 with supportive care. Counseled patient on nature of COVID-19 including modes of transmission, diagnostic testing, management and supportive care.  Offered symptomatic relief. Counseled patient on potential for adverse effects with medications prescribed/recommended today, ER and return-to-clinic precautions discussed, patient verbalized understanding.     Jaynee Eagles, PA-C 02/21/19 1753

## 2019-02-21 NOTE — ED Triage Notes (Signed)
Pt here for cough, sore throat, fever and body aches x 3 days

## 2019-02-25 LAB — CULTURE, GROUP A STREP (THRC)

## 2019-03-17 ENCOUNTER — Ambulatory Visit (HOSPITAL_COMMUNITY)
Admission: EM | Admit: 2019-03-17 | Discharge: 2019-03-17 | Disposition: A | Discharge status: Home / Self Care | Payer: Medicaid Other

## 2019-03-17 NOTE — ED Notes (Signed)
Patient here for covid testing but just had a positive test recently. Advised on CDC recommendations for retesting after a positive result. Patient verbalized understanding and left.

## 2019-05-08 IMAGING — US US MFM OB DETAIL+14 WK
1 series · 14 of 28 positions shown · non-contrast
Comparison: none

[Series 1: us mfm ob detail+14 wk · 99 acquisitions, 14 frames shown]
[im 4/99]
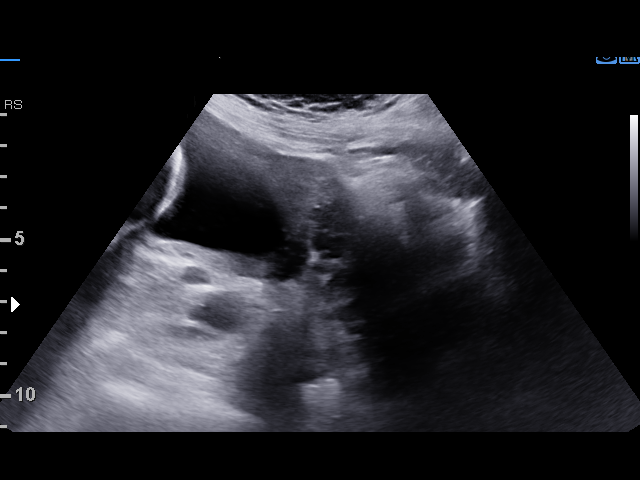
[im 11/99]
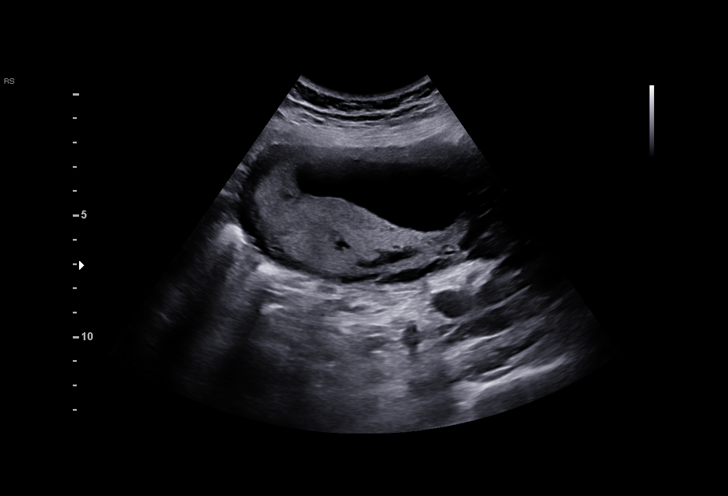
[im 19/99]
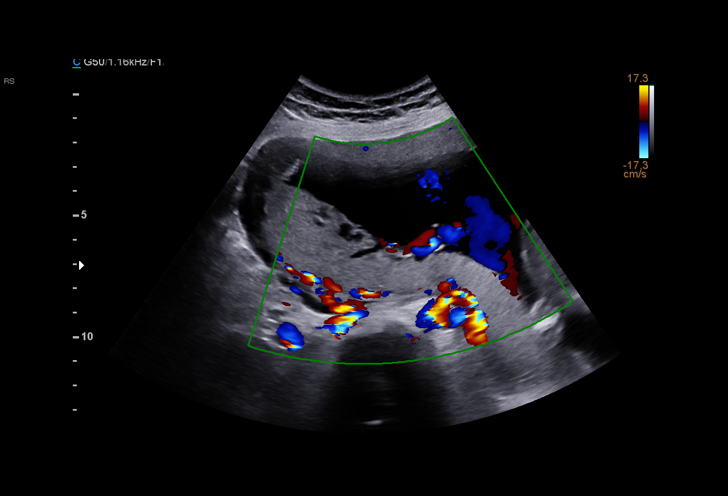
[im 26/99]
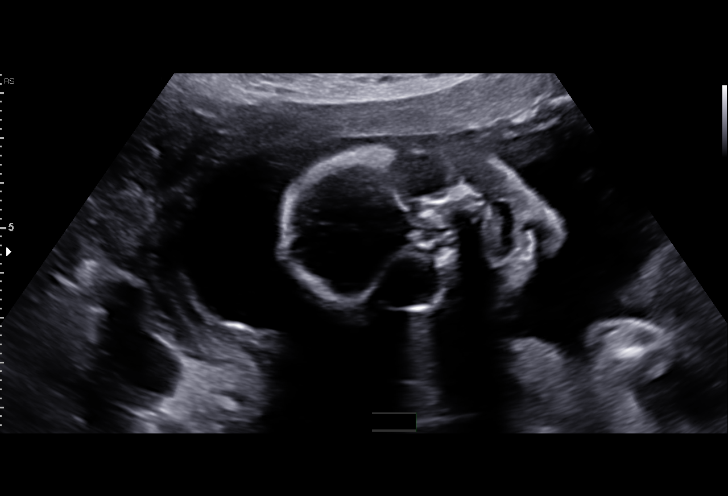
[im 33/99]
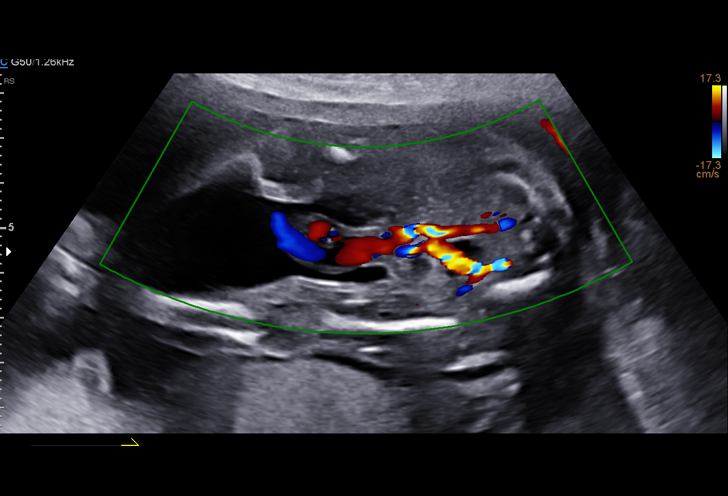
[im 40/99]
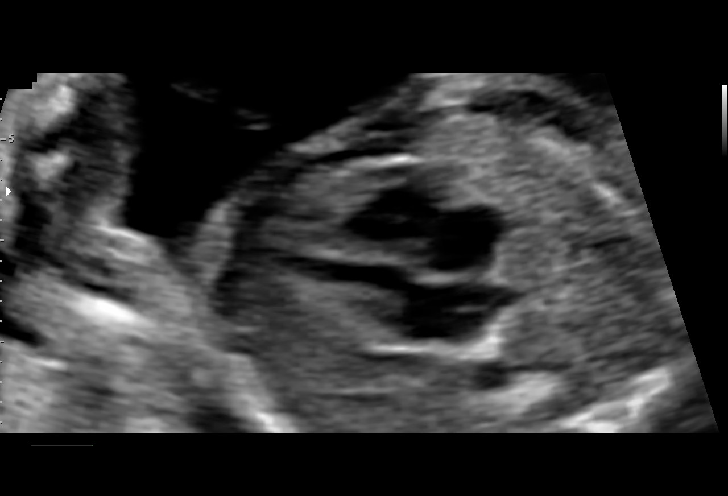
[im 48/99]
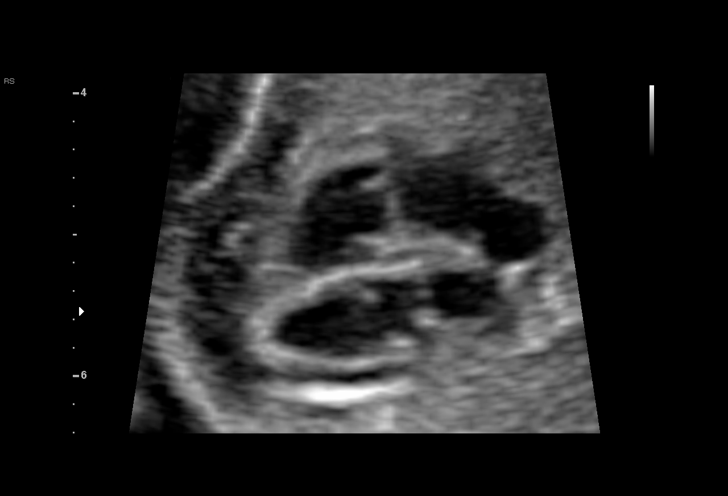
[im 55/99]
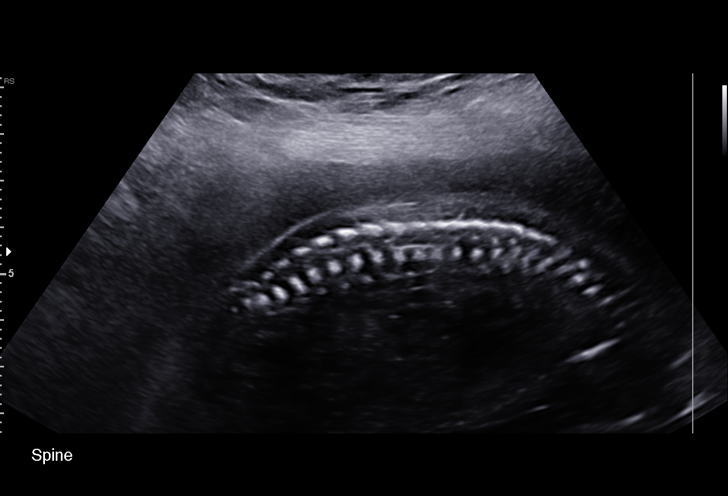
[im 62/99]
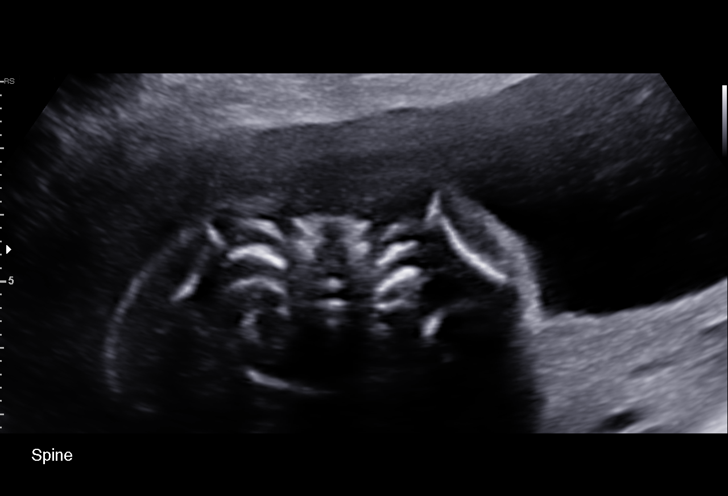
[im 69/99]
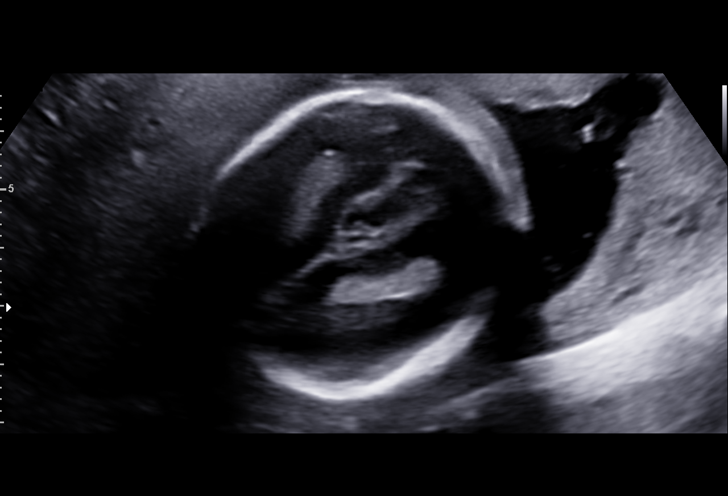
[im 77/99]
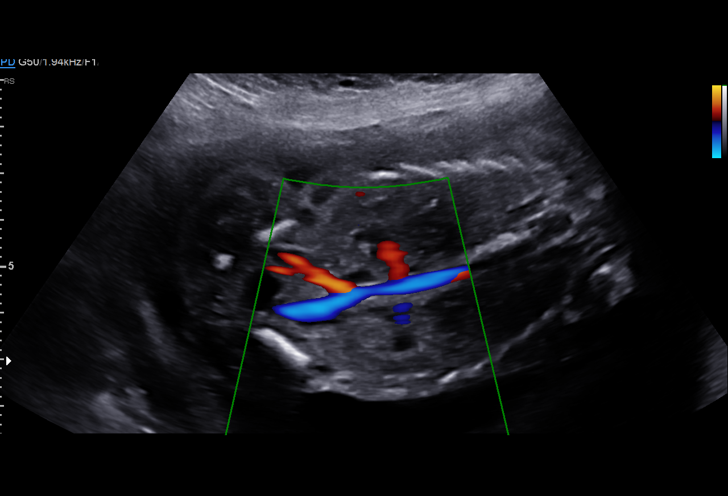
[im 84/99]
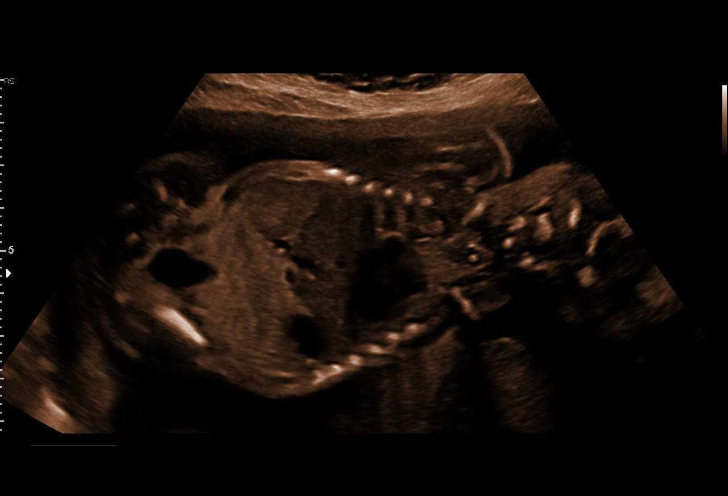
[im 91/99]
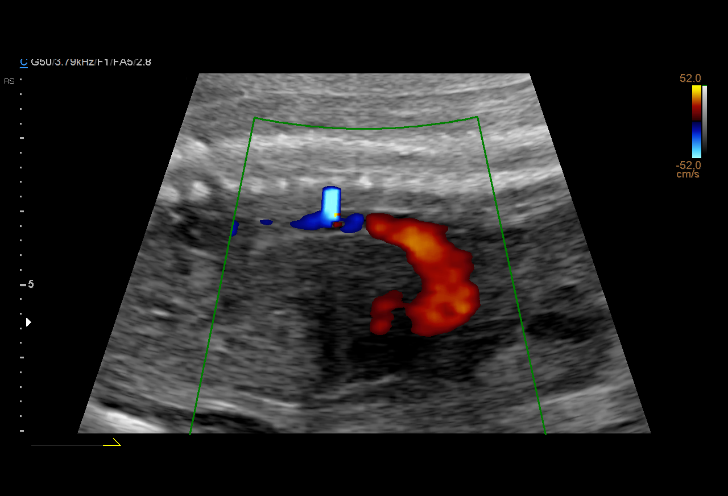
[im 99/99]
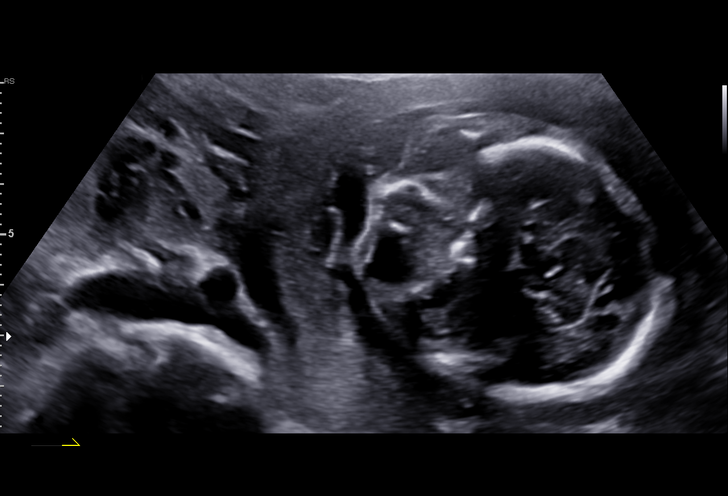

[14 of 28 positions shown; findings below may reference images not displayed]

OB/Gyn Clinic

1  ALTIJONA KUPLEN         [id number]      [id number]     [id number]
Indications

21 weeks gestation of pregnancy
History of cesarean delivery, currently
pregnant
Encounter for antenatal screening for
malformations
OB History

Gravidity:    2         Term:   1
Living:       1
Fetal Evaluation

Num Of Fetuses:     1
Fetal Heart         148
Rate(bpm):
Cardiac Activity:   Observed
Presentation:       Transverse, head to maternal right
Placenta:           Posterior, above cervical os
P. Cord Insertion:  Visualized, central

Amniotic Fluid
AFI FV:      Subjectively within normal limits

Largest Pocket(cm)
6.04
Biometry

BPD:      50.4  mm     G. Age:  21w 2d         41  %    CI:        74.46   %    70 - 86
FL/HC:      19.3   %    15.9 -
HC:      185.4  mm     G. Age:  20w 6d         19  %    HC/AC:      1.09        1.06 -
AC:      169.8  mm     G. Age:  22w 0d         60  %    FL/BPD:     71.0   %
FL:       35.8  mm     G. Age:  21w 2d         37  %    FL/AC:      21.1   %    20 - 24
HUM:      33.5  mm     G. Age:  21w 2d         46  %
CER:      22.5  mm     G. Age:  21w 2d         43  %
NFT:       4.6  mm
CM:        6.7  mm

Est. FW:     435  gm    0 lb 15 oz      48  %
Gestational Age

LMP:           19w 3d        Date:  08/10/16                 EDD:   05/17/17
U/S Today:     21w 3d                                        EDD:   05/03/17
Best:          21w 3d     Det. By:  U/S (12/24/16)           EDD:   05/03/17
Anatomy

Cranium:               Appears normal         Aortic Arch:            Appears normal
Cavum:                 Appears normal         Ductal Arch:            Not well visualized
Ventricles:            Appears normal         Diaphragm:              Appears normal
Choroid Plexus:        Appears normal         Stomach:                Appears normal, left
sided
Cerebellum:            Appears normal         Abdomen:                Appears normal
Posterior Fossa:       Appears normal         Abdominal Wall:         Appears nml (cord
insert, abd wall)
Nuchal Fold:           Appears normal         Cord Vessels:           Appears normal (3
vessel cord)
Face:                  Not well visualized    Kidneys:                Appear normal
Lips:                  Appears normal         Bladder:                Appears normal
Thoracic:              Appears normal         Spine:                  Appears normal
Heart:                 Appears normal         Upper Extremities:      Appears normal
(4CH, axis, and
situs)
RVOT:                  Appears normal         Lower Extremities:      Appears normal
LVOT:                  Appears normal

Other:  Heels and right 5th digit visualized. Fetus appears to be a male.
Technically difficult due to fetal position.
Cervix Uterus Adnexa

Cervix
Length:           4.13  cm.
Normal appearance by transabdominal scan.

Uterus
No abnormality visualized.

Left Ovary
Not visualized.

Right Ovary
Not visualized.

Adnexa:       No abnormality visualized. No adnexal mass
visualized.
Impression

SIUP at 21+3 weeks
Normal detailed fetal anatomy; limited views of face and the
DA
Markers of aneuploidy: none
Normal amniotic fluid volume
EDC based on today's measurements: 05/03/17
Recommendations

Follow-up ultrasound in 4 weeks to complete anatomy survey

## 2020-03-03 ENCOUNTER — Encounter (HOSPITAL_COMMUNITY): Payer: Self-pay

## 2020-03-03 ENCOUNTER — Other Ambulatory Visit: Payer: Self-pay

## 2020-03-03 ENCOUNTER — Ambulatory Visit (HOSPITAL_COMMUNITY)
Admission: EM | Admit: 2020-03-03 | Discharge: 2020-03-03 | Disposition: A | Discharge status: Home / Self Care | Payer: Medicaid Other

## 2020-03-03 ENCOUNTER — Encounter (HOSPITAL_COMMUNITY): Payer: Self-pay | Admitting: Emergency Medicine

## 2020-03-03 ENCOUNTER — Emergency Department (HOSPITAL_COMMUNITY)
Admission: EM | Admit: 2020-03-03 | Discharge: 2020-03-03 | Disposition: A | Discharge status: Home / Self Care | Payer: Medicaid Other | Attending: Emergency Medicine | Admitting: Emergency Medicine

## 2020-03-03 DIAGNOSIS — R202 Paresthesia of skin: Secondary | ICD-10-CM | POA: Insufficient documentation

## 2020-03-03 DIAGNOSIS — R29810 Facial weakness: Secondary | ICD-10-CM

## 2020-03-03 DIAGNOSIS — Z5321 Procedure and treatment not carried out due to patient leaving prior to being seen by health care provider: Secondary | ICD-10-CM | POA: Insufficient documentation

## 2020-03-03 DIAGNOSIS — H5712 Ocular pain, left eye: Secondary | ICD-10-CM

## 2020-03-03 DIAGNOSIS — H5789 Other specified disorders of eye and adnexa: Secondary | ICD-10-CM | POA: Insufficient documentation

## 2020-03-03 HISTORY — DX: Disorder of thyroid, unspecified: E07.9

## 2020-03-03 NOTE — ED Provider Notes (Signed)
[facility]    CSN: [id number] Arrival date & time: 03/03/20  1222      History   Chief Complaint Chief Complaint  Patient presents with  . right facial droop & numbness  . Headache  . Eye Problem    HPI Casey Obrien is a 30 y.o. female. Translator Amy [phone] assisted with translation today  HPI   Facial Droop: Patient presents with a right facial droop and numbness history that started yesterday early morning when she awoke. She also has a headache and eye pain of the right side. Associated blurry vision which she thinks may be from tearing. Pain rated 8/10 in nature.  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  At the time she did not have any ocular involvement.  She has not taking anything for current symptoms.  She was driven here by her husband.  Past Medical History:  Diagnosis Date  . Medical history non-contributory   . Thyroid disease     Patient Active Problem List   Diagnosis Date Noted  . Abnormal thyroid blood test 03/28/2018  . Acute blood loss anemia 05/01/2017  . Status post repeat low transverse cesarean section 04/29/2017  . Constipation 04/08/2017  . Carpal tunnel syndrome during pregnancy 03/23/2017  . Language barrier 02/23/2017  . History of C-section 12/03/2016    Past Surgical History:  Procedure Laterality Date  . APPENDECTOMY    . CESAREAN SECTION     cervix not dilating   . CESAREAN SECTION N/A 04/29/2017   Procedure: REPEAT CESAREAN SECTION;  Surgeon: Willodean Rosenthal, MD;  Location: [facility];  Service: Obstetrics;  Laterality: N/A;  . CESAREAN SECTION    . TONSILLECTOMY      OB History    Gravida  2   Para  2   Term  2   Preterm  0   AB  0   Living  2     SAB  0   IAB  0   Ectopic  0   Multiple      Live Births  2            Home Medications     Prior to Admission medications   Medication Sig Start Date End Date Taking? Authorizing Provider  levothyroxine (SYNTHROID) 50 MCG tablet Take 1 tablet by mouth daily. 04/30/19  Yes [provider]  acetaminophen (TYLENOL) 160 MG/5ML elixir Take 15 mg/kg by mouth every 4 (four) hours as needed for fever.    [provider]  benzonatate (TESSALON) 100 MG capsule Take 1-2 capsules (100-200 mg total) by mouth 3 (three) times daily as needed. 02/21/19   Wallis Bamberg, PA-C  promethazine-dextromethorphan (PROMETHAZINE-DM) 6.25-15 MG/5ML syrup Take 5 mLs by mouth at bedtime as needed for cough. 02/21/19   Wallis Bamberg, PA-C  iron polysaccharides (NIFEREX) 150 MG capsule Take 1 capsule (150 mg total) by mouth daily. Patient not taking: Reported on 01/25/2018 05/01/17 02/21/19  Donette Larry, CNM    Family History Family History  Problem Relation Age of Onset  . Hypertension Mother   . Diabetes Father   . Hypertension Father   . Heart disease Father   . Cancer Neg Hx   . Stroke Neg Hx     Social History Social History   Tobacco Use  . Smoking status: Never Smoker  .  Smokeless tobacco: Never Used  Vaping Use  . Vaping Use: Never used  Substance Use Topics  . Alcohol use: Never  . Drug use: Never     Allergies   Patient has no known allergies.   Review of Systems Review of Systems  As stated above in HPI  Physical Exam Triage Vital Signs ED Triage Vitals  Enc Vitals Group     BP 03/03/20 1248 110/69     Pulse Rate 03/03/20 1248 92     Resp 03/03/20 1248 19     Temp 03/03/20 1254 98.9 F (37.2 C)     Temp src --      SpO2 03/03/20 1248 99 %     Weight --      Height --      Head Circumference --      Peak Flow --      Pain Score 03/03/20 1250 6     Pain Loc --      Pain Edu? --      Excl. in GC? --    No data found.  Updated Vital Signs BP 110/69   Pulse 92   Temp 98.9 F (37.2 C)   Resp 19   LMP 03/02/2020 (Exact Date)   SpO2 99%    Breastfeeding No   Visual Acuity Right Eye Distance: 20/25 Left Eye Distance: 20/25 Bilateral Distance: 20/25  Physical Exam Vitals and nursing note reviewed.  Constitutional:      General: She is not in acute distress.    Appearance: She is not ill-appearing, toxic-appearing or diaphoretic.  HENT:     Head: Normocephalic.     Right Ear: Hearing and tympanic membrane normal.     Left Ear: Hearing and tympanic membrane normal.     Nose: Nose normal.     Mouth/Throat:     Mouth: Mucous membranes are moist.  Eyes:     General: Vision grossly intact. No visual field deficit.       Left eye: No foreign body, discharge or hordeolum.     Extraocular Movements: Extraocular movements intact.     Right eye: No nystagmus.     Left eye: No nystagmus.     Conjunctiva/sclera:     Left eye: Left conjunctiva is injected. No exudate or hemorrhage.    Pupils: Pupils are equal, round, and reactive to light. Pupils are equal.     Right eye: Pupil is round and reactive.     Left eye: Pupil is round and reactive.      Comments: Left eye with mild proptosis. No facial rash visualized. Mild to moderate left temple tenderness to palpation  Musculoskeletal:     Cervical back: Normal range of motion.  Lymphadenopathy:     Cervical: No cervical adenopathy.  Neurological:     Mental Status: She is alert and oriented to person, place, and time.     Cranial Nerves: Cranial nerve deficit (Eyebrow furrow is decreased along with slight decrease in overall left sided facial tone. Pt is able to smile and frown normally. Normal tongue movement) and facial asymmetry present.     Sensory: Sensation is intact.     Coordination: Coordination normal. Rapid alternating movements normal.     Gait: Gait is intact. Gait normal.     Deep Tendon Reflexes:     Reflex Scores:      Patellar reflexes are 2+ on the right side and 2+ on the left side.    Comments: Speech without  abnormality. No facial drooling     UC  Treatments / Results  Labs (all labs ordered are listed, but only abnormal results are displayed) Labs Reviewed - No data to display  EKG   Radiology No results found.  Procedures Procedures (including critical care time)  Medications Ordered in UC Medications - No data to display  Initial Impression / Assessment and Plan / UC Course  I have reviewed the triage vital signs and the nursing notes.  Pertinent labs & imaging results that were available during my care of the patient were reviewed by me and considered in my medical decision making (see chart for details).    New. Outside of code stroke status given that she has had symptoms for over 24 hours. Given ophthalmic findings Bells Palsy is less likely and DX such as Guillian-Barre moves up higher on differential.  I discussed all of this with patient and she reported understanding.  I would like for her to be evaluated in the emergency room given her symptoms and complexity of symptoms.  She is agreeable.  She prefers private vehicle transportation via her husband.  They are agreeable to transport directly to the emergency room. Red flag sign and symptoms discussed.  Final Clinical Impressions(s) / UC Diagnoses   Final diagnoses:  Pain of left eye  Facial droop     Discharge Instructions     Please go directly to the Emergency room     ED Prescriptions    None     PDMP not reviewed this encounter.   Rushie Chestnut, [place] 03/03/20 1510

## 2020-03-03 NOTE — ED Notes (Signed)
Called for repeat VS x3, no response. 

## 2020-03-03 NOTE — ED Triage Notes (Addendum)
Pt reports L eye redness, swelling, and burning that started yesterday and numbness to bilateral hands and fingers since yesterday.  No arm drift.  No facial droop.  Clydie Braun, PA notified of pt and no additional orders at this time.

## 2020-03-03 NOTE — ED Triage Notes (Addendum)
Pt in with c/o right facial droop and right arm weakness and numbness that started yesterday. Pt also c/o headache and  blurry vision in left eye that is also swollen  She states she had similar incident years ago   Denies any slurred speech   No upper extremity drifting, bilateral grips +2

## 2020-03-03 NOTE — Discharge Instructions (Addendum)
Please go directly to the Emergency room  

## 2021-05-15 ENCOUNTER — Encounter (HOSPITAL_COMMUNITY): Payer: Self-pay | Admitting: Emergency Medicine

## 2021-05-15 ENCOUNTER — Ambulatory Visit (HOSPITAL_COMMUNITY)
Admission: EM | Admit: 2021-05-15 | Discharge: 2021-05-15 | Disposition: A | Discharge status: Home / Self Care | Payer: Medicaid Other | Attending: Emergency Medicine | Admitting: Emergency Medicine

## 2021-05-15 DIAGNOSIS — M26622 Arthralgia of left temporomandibular joint: Secondary | ICD-10-CM | POA: Diagnosis not present

## 2021-05-15 MED ORDER — NAPROXEN 500 MG PO TABS: 500.0000 mg | ORAL_TABLET | Freq: Two times a day (BID) | ORAL | 0 refills | Status: DC

## 2021-05-15 MED ORDER — CYCLOBENZAPRINE HCL 10 MG PO TABS: 10.0000 mg | ORAL_TABLET | Freq: Every day | ORAL | 0 refills | Status: DC

## 2021-05-15 NOTE — ED Provider Notes (Signed)
HPI ? ?SUBJECTIVE: ? ?Casey Obrien is a 31 y.o. female who presents with left ear, neck, jaw pain starting last night.  It is intermittent.  She cannot say how long it is present and is unable to describe the pain.  She states that she cannot eat, swallow, talk secondary to the pain.  She denies swelling in her throat.  No fevers, new or different nasal congestion, sinus pain or pressure, dental pain, change in hearing, otorrhea, facial rash, paresthesias in the area of pain.  She states that her ear feels "blocked".  No facial swelling, swelling of any of the jaw, trismus, neck stiffness, drooling.  She is not sure if she grinds her teeth at night.  She has tried Tylenol and TheraFlu with some improvement in her symptoms.  Symptoms are worse with eating, coughing, talking.  No antipyretic in the past 6 hours.  Past medical history of tonsillectomy.  No history of TMJ arthralgia, frequent otitis media.  LMP: March 26.  Denies the possibility of being pregnant.  PCP: In [place] ? ?All history obtained through video interpreter. ? ?Past Medical History:  ?Diagnosis Date  ? Medical history non-contributory   ? Thyroid disease   ? ? ?Past Surgical History:  ?Procedure Laterality Date  ? APPENDECTOMY    ? CESAREAN SECTION    ? cervix not dilating   ? CESAREAN SECTION N/A 04/29/2017  ? Procedure: REPEAT CESAREAN SECTION;  Surgeon: Willodean Rosenthal, MD;  Location: [facility];  Service: Obstetrics;  Laterality: N/A;  ? CESAREAN SECTION    ? TONSILLECTOMY    ? ? ?Family History  ?Problem Relation Age of Onset  ? Hypertension Mother   ? Diabetes Father   ? Hypertension Father   ? Heart disease Father   ? Cancer Neg Hx   ? Stroke Neg Hx   ? ? ?Social History  ? ?Tobacco Use  ? Smoking status: Never  ? Smokeless tobacco: Never  ?Vaping Use  ? Vaping Use: Never used  ?Substance Use Topics  ? Alcohol use: Never  ? Drug use: Never  ? ? ?No current facility-administered medications for this  encounter. ? ?Current Outpatient Medications:  ?  cyclobenzaprine (FLEXERIL) 10 MG tablet, Take 1 tablet (10 mg total) by mouth at bedtime., Disp: 20 tablet, Rfl: 0 ?  naproxen (NAPROSYN) 500 MG tablet, Take 1 tablet (500 mg total) by mouth 2 (two) times daily., Disp: 20 tablet, Rfl: 0 ?  levothyroxine (SYNTHROID) 50 MCG tablet, Take 1 tablet by mouth daily., Disp: , Rfl:  ?  promethazine-dextromethorphan (PROMETHAZINE-DM) 6.25-15 MG/5ML syrup, Take 5 mLs by mouth at bedtime as needed for cough., Disp: 100 mL, Rfl: 0 ? ?No Known Allergies ? ? ?ROS ? ?As noted in HPI.  ? ?Physical Exam ? ?BP 109/71 (BP Location: Right Arm)   Pulse 79   Temp 98.3 ?F (36.8 ?C) (Oral)   Resp 18   LMP 04/20/2021   SpO2 99%  ? ?Constitutional: Well developed, well nourished, no acute distress ?Eyes:  EOMI, conjunctiva normal bilaterally ?HENT: Normocephalic, atraumatic,mucus membranes moist.  Left external ear, EAC, TM appears normal.  Hearing intact left ear.  No pain with traction on pinna, palpation of mastoid.  Exquisite tenderness with palpation of tragus, left TMJ.  No jaw crepitus.  Dentition normal, nontender.  No facial swelling, submandibular swelling. ?Neck: No appreciable cervical lymphadenopathy. ?Respiratory: Normal inspiratory effort ?Cardiovascular: Normal rate ?GI: nondistended ?skin: No facial rash, skin intact ?Musculoskeletal: no deformities ?Neurologic: Alert &  oriented x 3, no focal neuro deficits ?Psychiatric: Speech and behavior appropriate ? ? ?ED Course ? ? ?Medications - No data to display ? ?No orders of the defined types were placed in this encounter. ? ? ?No results found for this or any previous visit (from the past 24 hour(s)). ?No results found. ? ?ED Clinical Impression ? ?1. Arthralgia of left temporomandibular joint   ?  ? ?ED Assessment/Plan ? ?Presentation consistent with left TMJ arthralgia.  No evidence of otitis media, otitis externa, parotitis, submandibular salivary gland stone or infection,  shingles.  Home with Naprosyn/Tylenol twice a day, Flexeril, soft diet for the next 4 to 5 days.  She is to follow-up with a dentist if she gets better.  She can return here if not better after for 5 days. ? ?Using the video interpreter, discussed MDM, treatment plan, and plan for follow-up with patient.  patient agrees with plan.  ? ?Meds ordered this encounter  ?Medications  ? cyclobenzaprine (FLEXERIL) 10 MG tablet  ?  Sig: Take 1 tablet (10 mg total) by mouth at bedtime.  ?  Dispense:  20 tablet  ?  Refill:  0  ? naproxen (NAPROSYN) 500 MG tablet  ?  Sig: Take 1 tablet (500 mg total) by mouth 2 (two) times daily.  ?  Dispense:  20 tablet  ?  Refill:  0  ? ? ? ? ?*This clinic note was created using Scientist, clinical (histocompatibility and immunogenetics). Therefore, there may be occasional mistakes despite careful proofreading. ? ??  ?  ?Domenick Gong, MD ?05/15/21 1554 ? ?

## 2021-05-15 NOTE — Discharge Instructions (Addendum)
Take the Naprosyn with 2 extra strength Tylenol twice a day.  Take the Flexeril at night.  It will help prevent you from grinding your teeth at night.  Soft diet for the next 4 to 5 days. ?

## 2021-05-15 NOTE — ED Triage Notes (Signed)
C/o left facial pain that radiates to throat since yesterday. Hurts to talk, swallow, and eat.  ?

## 2021-06-24 ENCOUNTER — Ambulatory Visit (HOSPITAL_COMMUNITY)
Admission: EM | Admit: 2021-06-24 | Discharge: 2021-06-24 | Disposition: A | Discharge status: Home / Self Care | Payer: Medicaid Other | Attending: Emergency Medicine | Admitting: Emergency Medicine

## 2021-06-24 ENCOUNTER — Encounter (HOSPITAL_COMMUNITY): Payer: Self-pay

## 2021-06-24 DIAGNOSIS — H60391 Other infective otitis externa, right ear: Secondary | ICD-10-CM

## 2021-06-24 DIAGNOSIS — R051 Acute cough: Secondary | ICD-10-CM | POA: Diagnosis not present

## 2021-06-24 MED ORDER — PROMETHAZINE-DM 6.25-15 MG/5ML PO SYRP: 5.0000 mL | ORAL_SOLUTION | Freq: Every evening | ORAL | 0 refills | Status: DC | PRN

## 2021-06-24 MED ORDER — NEOMYCIN-POLYMYXIN-HC 3.5-10000-1 OT SUSP: 4.0000 [drp] | Freq: Three times a day (TID) | OTIC | 0 refills | Status: DC

## 2021-06-24 MED ORDER — BENZONATATE 100 MG PO CAPS: 100.0000 mg | ORAL_CAPSULE | Freq: Three times a day (TID) | ORAL | 0 refills | Status: DC

## 2021-06-24 MED ORDER — PREDNISONE 20 MG PO TABS: 40.0000 mg | ORAL_TABLET | Freq: Every day | ORAL | 0 refills | Status: DC

## 2021-06-24 NOTE — ED Triage Notes (Signed)
Onset one week ago starting with the cough. Yesterday the insomnia started and two days ago the ear pain started in the right ear.   No one around the patient is sick. Dry cough.

## 2021-06-24 NOTE — Discharge Instructions (Addendum)
?????? - ????? ??????? ???? ??????? ??   8 ????? ??? ?????? - ????? ??????? ???? ?????? ?? ??? ????? ? ????? ?? ??? ?? ????? ???? ??????? - ????? ??????? ???? ?????? ???? ???? ???? ???? ?????? ????? ????? ???? - ????? ??????? ?????? ?? ??? ????? ?????? ??? ????? ?????? ? ???? ?? ??? ???? ???? ????? ? ????? ??? ????? ???? ??????? ??????? ?? ?????? ???? 10 ??? 15 ????? ??? ????? - ????? ??????? ????? ????? ?? ????? ???????? ?? ????? ????? ??? ???? ?? ?????? - ????? ???????? ?????? ????? ??? ??????  ????? - ?? ????? ??? ???? ?????? ????? ?? ???? ????? ???? ?? ??? ???? ?? ?????? ?? ???? ????? - ?? 4 ????? ?? ????? ????? ?? ???? ?????? 3 ???? ?????? ???? 7 ???? - ????? ??????? ???????? ?? ?????????? ???????? ?? ?????? ???? ?????? - ????? ??? ?????? ????? ??? ???? ???????? ?????? - ?? ????? ????? ?? ??? ????? ?? ????? ?? ???? ???? ??? ????? ???? ??????? ?????? ???? ??????? - ????? ???????? ?????? ????? ??? ?????? mawqie CreditCardReferences.is lisiealik - [place] astikhdam hubub tisalun kula 8 saeat hasab alhaja - [place] astikhdam sharab alsueal fi waqt alnawm , waelam 'ana hadha qad [place] tasheur bialnueas - [place] astikhdam dawa' dilsim bidun wasfat tibiyat tawal alnahar lisueal 'iidafiin daeim - [place] astikhdam almuratib fi waqt alnawm lilhifaz ealaa rutubat alhawa' , wa'iidha lam yakun ladayk jihaz tartib , [place] jael hamamik mushbae bialbukhar waljulus fi aldaakhil limudat 10 'iilaa 15 daqiqat qabl alnawm - [place] astikhdam malaeiq saghirat min aleasal lilmusaeadat fi tartib alhalq mimaa yuqalil min alsueal - [place] almutabaeat birieayat eajilat hasab alhaja li'udhunik - fi alfahs kan hunak aihmirar watawarum fi qanaat al'udhun walakin lam yakun hunak 'ayu aihmirar fi tablat al'udhun - die 4 qatarat min qatarat al'udhun fi 'udhunik alyumnaa 3 maraat ywmyan limudat 7 'ayaam - [place] astikhdam taylinul 'aw 'iibubrufin lilmusaeadat fi alshueur bieadam alraaha - [place] wade kamadat  dafiat ealaa 'udhunik alkharijiat lilraaha - la tuhawil tanzif 'aw wade 'ashya' 'aw sawayil fi qanaat 'udhunik hataa taktamil jamie al'adwiat watuealij jamie al'aerad - [place] almutabaeat birieayat eajilat hasab alhaja     CreditCardReferences.is   For your cough -You may use Tessalon pill every 8 hours as needed -You may use cough syrup at bedtime, be mindful this may make you drowsy -You may use over-the-counter Delsym throughout the daytime for additional supportive cough -You may use a humidifier at bedtime to keep air moistened, if you do not have a humidifier you may make your bathroom steamy and sit inside for 10 to 15 minutes prior to going to sleep -You may use teaspoons of honey to help moisten your throat which will reduce coughing -You may follow-up with urgent care as needed  For your ear -On exam there was redness and swelling in the ear canal but none to the eardrum -Place 4 drops of eardrops into your right ear 3 times a day for 7 days -You may use Tylenol or ibuprofen to help with discomfort -You may hold warm compresses to your outer ear for comfort -Do not attempt to clean or place objects or fluid went into your ear canal until all medicine is complete and all symptoms have resolved -You may follow-up with urgent care as needed

## 2021-06-24 NOTE — ED Provider Notes (Signed)
[facility]    CSN: [id number] Arrival date & time: 06/24/21  1038      History   Chief Complaint Chief Complaint  Patient presents with   Cough   Headache   Otalgia   Insomnia    HPI Casey Obrien is a 31 y.o. female.   Patient presents with non productive dry cough for 7 days beginning to interfere with sleep 2 days ago. Associate sob prior to coughing beginning. Endorses right sided ear beginning 2 days. Tolerating food and liquids. No known sick contacts. Has attempted otc medication which was ineffective. Denies fever, congestion ,wheezing.   Declined interpreter, husband assistant  with translation   Past Medical History:  Diagnosis Date   Medical history non-contributory    Thyroid disease     Patient Active Problem List   Diagnosis Date Noted   Abnormal thyroid blood test 03/28/2018   Acute blood loss anemia 05/01/2017   Status post repeat low transverse cesarean section 04/29/2017   Constipation 04/08/2017   Carpal tunnel syndrome during pregnancy 03/23/2017   Language barrier 02/23/2017   History of C-section 12/03/2016    Past Surgical History:  Procedure Laterality Date   APPENDECTOMY     CESAREAN SECTION     cervix not dilating    CESAREAN SECTION N/A 04/29/2017   Procedure: REPEAT CESAREAN SECTION;  Surgeon: Willodean RosenthalHarraway-Smith, Carolyn, MD;  [facility];  Service: Obstetrics;  Laterality: N/A;   CESAREAN SECTION     TONSILLECTOMY      OB History     Gravida  2   Para  2   Term  2   Preterm  0   AB  0   Living  2      SAB  0   IAB  0   Ectopic  0   Multiple      Live Births  2            Home Medications    Prior to Admission medications   Medication Sig Start Date End Date Taking? Authorizing Provider  levothyroxine (SYNTHROID) 50 MCG tablet Take 1 tablet by mouth daily. 04/30/19  Yes [provider]  cyclobenzaprine (FLEXERIL) 10 MG tablet Take 1 tablet (10 mg total) by mouth  at bedtime. 05/15/21   Domenick GongMortenson, Ashley, MD  naproxen (NAPROSYN) 500 MG tablet Take 1 tablet (500 mg total) by mouth 2 (two) times daily. 05/15/21   Domenick GongMortenson, Ashley, MD  promethazine-dextromethorphan (PROMETHAZINE-DM) 6.25-15 MG/5ML syrup Take 5 mLs by mouth at bedtime as needed for cough. 02/21/19   Wallis BambergMani, Mario, PA-C  iron polysaccharides (NIFEREX) 150 MG capsule Take 1 capsule (150 mg total) by mouth daily. Patient not taking: Reported on 01/25/2018 05/01/17 02/21/19  Donette LarryBhambri, Melanie, CNM    Family History Family History  Problem Relation Age of Onset   Hypertension Mother    Diabetes Father    Hypertension Father    Heart disease Father    Cancer Neg Hx    Stroke Neg Hx     Social History Social History   Tobacco Use   Smoking status: Never   Smokeless tobacco: Never  Vaping Use   Vaping Use: Never used  Substance Use Topics   Alcohol use: Never   Drug use: Never     Allergies   Patient has no known allergies.   Review of Systems Review of Systems  Constitutional: Negative.   HENT:  Positive for ear pain and sore throat. Negative for  congestion, dental problem, drooling, ear discharge, facial swelling, hearing loss, mouth sores, nosebleeds, postnasal drip, rhinorrhea, sinus pressure, sinus pain, sneezing, tinnitus, trouble swallowing and voice change.   Respiratory:  Positive for cough and shortness of breath. Negative for apnea, choking, chest tightness, wheezing and stridor.   Cardiovascular: Negative.   Gastrointestinal: Negative.   Skin: Negative.   Neurological:  Positive for headaches. Negative for dizziness, tremors, seizures, syncope, facial asymmetry, speech difficulty, weakness, light-headedness and numbness.    Physical Exam Triage Vital Signs ED Triage Vitals  Enc Vitals Group     BP 06/24/21 1109 107/70     Pulse Rate 06/24/21 1109 94     Resp 06/24/21 1109 16     Temp 06/24/21 1109 97.8 F (36.6 C)     Temp Source 06/24/21 1109 Oral     SpO2  06/24/21 1109 96 %     Weight 06/24/21 1109 155 lb (70.3 kg)     Height 06/24/21 1109 5\' 2"  (1.575 m)     Head Circumference --      Peak Flow --      Pain Score 06/24/21 1107 8     Pain Loc --      Pain Edu? --      Excl. in GC? --    No data found.  Updated Vital Signs BP 107/70 (BP Location: Right Arm)   Pulse 94   Temp 97.8 F (36.6 C) (Oral)   Resp 16   Ht 5\' 2"  (1.575 m)   Wt 155 lb (70.3 kg)   SpO2 96%   BMI 28.35 kg/m   Visual Acuity Right Eye Distance:   Left Eye Distance:   Bilateral Distance:    Right Eye Near:   Left Eye Near:    Bilateral Near:     Physical Exam Constitutional:      Appearance: Normal appearance.  HENT:     Head: Normocephalic.     Right Ear: Tympanic membrane and external ear normal.     Left Ear: Tympanic membrane, ear canal and external ear normal.     Ears:     Comments: Erythema and mild swelling noted to the ear canal without involvement of the tympanic membrane     Nose: Nose normal.     Mouth/Throat:     Mouth: Mucous membranes are moist.     Pharynx: Oropharynx is clear.  Eyes:     Extraocular Movements: Extraocular movements intact.  Cardiovascular:     Rate and Rhythm: Normal rate and regular rhythm.     Pulses: Normal pulses.     Heart sounds: Normal heart sounds.  Pulmonary:     Effort: Pulmonary effort is normal.     Breath sounds: Normal breath sounds.     Comments: Dry cough witnessed  Musculoskeletal:     Cervical back: Normal range of motion and neck supple.  Skin:    General: Skin is warm and dry.  Neurological:     Mental Status: She is alert and oriented to person, place, and time. Mental status is at baseline.  Psychiatric:        Mood and Affect: Mood normal.        Behavior: Behavior normal.     UC Treatments / Results  Labs (all labs ordered are listed, but only abnormal results are displayed) Labs Reviewed - No data to display  EKG   Radiology No results  found.  Procedures Procedures (including critical care time)  Medications Ordered  in UC Medications - No data to display  Initial Impression / Assessment and Plan / UC Course  I have reviewed the triage vital signs and the nursing notes.  Pertinent labs & imaging results that were available during my care of the patient were reviewed by me and considered in my medical decision making (see chart for details).  Acute infective otitis externa of right ear Acute cough  Etiology of symptoms is most likely viral, vitals are stable, 02 96 percent on room air, lungs are clear, cough mostly exacerbated by upper airway irritation, prescribed prednisone 40 mg burst, tessalon and promethazine DM for management, recommended good fluid intake, humidifier, steamed showers for additional support , erythema and mild swelling noted to the right ear canal, cortisporin drops prescribed, dicussed administration, advice tylenol ibuprofen and warm compresses for additional comfort, advised against any ear cleaning, may follow up with urgent care as needed.  Final Clinical Impressions(s) / UC Diagnoses   Final diagnoses:  None   Discharge Instructions   None    ED Prescriptions   None    PDMP not reviewed this encounter.   Valinda Hoar, NP 06/24/21 1230

## 2021-06-28 ENCOUNTER — Ambulatory Visit (HOSPITAL_COMMUNITY)
Admission: EM | Admit: 2021-06-28 | Discharge: 2021-06-28 | Disposition: A | Discharge status: Home / Self Care | Payer: Medicaid Other | Attending: Physician Assistant | Admitting: Physician Assistant

## 2021-06-28 ENCOUNTER — Encounter (HOSPITAL_COMMUNITY): Payer: Self-pay | Admitting: Emergency Medicine

## 2021-06-28 ENCOUNTER — Ambulatory Visit (INDEPENDENT_AMBULATORY_CARE_PROVIDER_SITE_OTHER): Payer: Medicaid Other

## 2021-06-28 DIAGNOSIS — R051 Acute cough: Secondary | ICD-10-CM

## 2021-06-28 DIAGNOSIS — J9801 Acute bronchospasm: Secondary | ICD-10-CM

## 2021-06-28 DIAGNOSIS — R059 Cough, unspecified: Secondary | ICD-10-CM | POA: Diagnosis not present

## 2021-06-28 MED ORDER — ALBUTEROL SULFATE HFA 108 (90 BASE) MCG/ACT IN AERS
2.0000 | INHALATION_SPRAY | Freq: Once | RESPIRATORY_TRACT | Status: AC
  Administered 2021-06-28: 2 via RESPIRATORY_TRACT

## 2021-06-28 MED ORDER — PREDNISONE 10 MG (21) PO TBPK: ORAL_TABLET | ORAL | 0 refills | Status: DC

## 2021-06-28 MED ORDER — ALBUTEROL SULFATE HFA 108 (90 BASE) MCG/ACT IN AERS
INHALATION_SPRAY | RESPIRATORY_TRACT | Status: AC
  Filled 2021-06-28: qty 6.7

## 2021-06-28 NOTE — ED Triage Notes (Signed)
Cough ongoing over a week, worse at night when trying to sleep, reports difficulty talking due to cough. Denies fever, seen recently for same.

## 2021-06-28 NOTE — Discharge Instructions (Signed)
I believe that you have asthma.  Use albuterol inhaler every 4-6 hours as needed.  Start prednisone taper.  Do not take NSAIDs including aspirin, ibuprofen/Advil, naproxen/Aleve with this medication.  Follow-up with your PCP as scheduled.  If anything worsens go to the emergency room.

## 2021-06-28 NOTE — ED Provider Notes (Addendum)
[facility]    CSN: [id number] Arrival date & time: 06/28/21  1636      History   Chief Complaint Chief Complaint  Patient presents with   Cough    HPI Casey Obrien is a 31 y.o. female.   Patient presents today with a 2-week history of severe cough.  Patient is Arabic speaking and interpreter was utilized during visit.  She reports that intermittently for the past several years she has had cough and has seen an allergist who prescribed acid reflux medication and is in the process of arranging allergy testing.  She denies formal diagnosis of allergies.  She denies diagnosis of asthma, COPD, she does not smoke.  She was seen by our clinic on 05/28/2021 at which point she was experiencing otalgia and cough.  She was prescribed otic antibiotics as well as prednisone burst.  She denies any significant improvement of symptoms with this medication regimen.  She reports that her cough is severe and interfering with her ability to sleep at night.  She denies any fever, shortness of breath, nausea, vomiting.  She is confident that she is not pregnant.  She is not taking any over-the-counter medication for symptom management.  She does report having an inhaler several years ago but has not used one recently.   Past Medical History:  Diagnosis Date   Medical history non-contributory    Thyroid disease     Patient Active Problem List   Diagnosis Date Noted   Abnormal thyroid blood test 03/28/2018   Acute blood loss anemia 05/01/2017   Status post repeat low transverse cesarean section 04/29/2017   Constipation 04/08/2017   Carpal tunnel syndrome during pregnancy 03/23/2017   Language barrier 02/23/2017   History of C-section 12/03/2016    Past Surgical History:  Procedure Laterality Date   APPENDECTOMY     CESAREAN SECTION     cervix not dilating    CESAREAN SECTION N/A 04/29/2017   Procedure: REPEAT CESAREAN SECTION;  Surgeon: Willodean Rosenthal, MD;  Location: [facility]  BIRTHING SUITES;  Service: Obstetrics;  Laterality: N/A;   CESAREAN SECTION     TONSILLECTOMY      OB History     Gravida  2   Para  2   Term  2   Preterm  0   AB  0   Living  2      SAB  0   IAB  0   Ectopic  0   Multiple      Live Births  2            Home Medications    Prior to Admission medications   Medication Sig Start Date End Date Taking? Authorizing Provider  predniSONE (STERAPRED UNI-PAK 21 TAB) 10 MG (21) TBPK tablet As directed 06/28/21  Yes Maejor Erven K, PA-C  benzonatate (TESSALON) 100 MG capsule Take 1 capsule (100 mg total) by mouth every 8 (eight) hours. 06/24/21   White, Elita Boone, NP  cyclobenzaprine (FLEXERIL) 10 MG tablet Take 1 tablet (10 mg total) by mouth at bedtime. 05/15/21   Domenick Gong, MD  levothyroxine (SYNTHROID) 50 MCG tablet Take 1 tablet by mouth daily. 04/30/19   [provider]  naproxen (NAPROSYN) 500 MG tablet Take 1 tablet (500 mg total) by mouth 2 (two) times daily. 05/15/21   Domenick Gong, MD  neomycin-polymyxin-hydrocortisone (CORTISPORIN) 3.5-10000-1 OTIC suspension Place 4 drops into the right ear 3 (three) times daily. 06/24/21   Valinda Hoar, NP  promethazine-dextromethorphan (PROMETHAZINE-DM) 6.25-15 MG/5ML syrup Take 5 mLs by mouth at bedtime as needed for cough. 06/24/21   Valinda Hoar, NP  iron polysaccharides (NIFEREX) 150 MG capsule Take 1 capsule (150 mg total) by mouth daily. Patient not taking: Reported on 01/25/2018 05/01/17 02/21/19  Donette Larry, CNM    Family History Family History  Problem Relation Age of Onset   Hypertension Mother    Diabetes Father    Hypertension Father    Heart disease Father    Cancer Neg Hx    Stroke Neg Hx     Social History Social History   Tobacco Use   Smoking status: Never   Smokeless tobacco: Never  Vaping Use   Vaping Use: Never used  Substance Use Topics   Alcohol use: Never   Drug use: Never     Allergies   Patient has no  known allergies.   Review of Systems Review of Systems  Constitutional:  Positive for activity change. Negative for appetite change, fatigue and fever.  HENT:  Negative for congestion, ear pain, sinus pressure, sneezing and sore throat.   Respiratory:  Positive for cough. Negative for shortness of breath.   Cardiovascular:  Negative for chest pain.  Gastrointestinal:  Negative for abdominal pain, diarrhea, nausea and vomiting.  Neurological:  Positive for headaches. Negative for dizziness and light-headedness.    Physical Exam Triage Vital Signs ED Triage Vitals [06/28/21 1725]  Enc Vitals Group     BP 102/76     Pulse Rate 89     Resp 16     Temp 98.3 F (36.8 C)     Temp Source Oral     SpO2 98 %     Weight      Height      Head Circumference      Peak Flow      Pain Score 7     Pain Loc      Pain Edu?      Excl. in GC?    No data found.  Updated Vital Signs BP 102/76 (BP Location: Right Arm)   Pulse 89   Temp 98.3 F (36.8 C) (Oral)   Resp 16   SpO2 98%   Visual Acuity Right Eye Distance:   Left Eye Distance:   Bilateral Distance:    Right Eye Near:   Left Eye Near:    Bilateral Near:     Physical Exam Vitals reviewed.  Constitutional:      General: She is awake. She is not in acute distress.    Appearance: Normal appearance. She is well-developed. She is not ill-appearing.     Comments: Very pleasant female appears stated age in no acute distress sitting comfortably in exam room  HENT:     Head: Normocephalic and atraumatic.     Right Ear: Tympanic membrane, ear canal and external ear normal. Tympanic membrane is not erythematous or bulging.     Left Ear: Tympanic membrane, ear canal and external ear normal. Tympanic membrane is not erythematous or bulging.     Nose:     Right Sinus: No maxillary sinus tenderness or frontal sinus tenderness.     Left Sinus: No maxillary sinus tenderness or frontal sinus tenderness.     Mouth/Throat:     Pharynx:  Uvula midline. Posterior oropharyngeal erythema present. No oropharyngeal exudate.  Cardiovascular:     Rate and Rhythm: Normal rate and regular rhythm.     Heart sounds: Normal heart sounds, S1  normal and S2 normal. No murmur heard. Pulmonary:     Effort: Pulmonary effort is normal.     Breath sounds: Wheezing present. No rhonchi or rales.  Psychiatric:        Behavior: Behavior is cooperative.     UC Treatments / Results  Labs (all labs ordered are listed, but only abnormal results are displayed) Labs Reviewed - No data to display  EKG   Radiology DG Chest 2 View  Result Date: 06/28/2021 CLINICAL DATA:  worsening cough EXAM: CHEST - 2 VIEW COMPARISON:  None Available. FINDINGS: The heart and mediastinal contours are within normal limits. Collimation of bilateral lung apices. No focal consolidation. No pulmonary edema. No pleural effusion. No pneumothorax. No acute osseous abnormality. IMPRESSION: No active cardiopulmonary disease. Electronically Signed   By: Tish FredericksonMorgane  Naveau M.D.   On: 06/28/2021 18:06    Procedures Procedures (including critical care time)  Medications Ordered in UC Medications  albuterol (VENTOLIN HFA) 108 (90 Base) MCG/ACT inhaler 2 puff (2 puffs Inhalation Given 06/28/21 1804)    Initial Impression / Assessment and Plan / UC Course  I have reviewed the triage vital signs and the nursing notes.  Pertinent labs & imaging results that were available during my care of the patient were reviewed by me and considered in my medical decision making (see chart for details).     Chest x-ray was obtained given prolonged symptoms which showed no active cardiopulmonary disease.  She was given albuterol in clinic with significant improvement of symptoms.  She was sent home with an albuterol inhaler with instruction to use this every 4-6 hours as needed for coughing fits and shortness of breath.  Will extend prednisone taper as suspect symptoms are related to undiagnosed  asthma/bronchitis.  Discussed that she should not take NSAIDs with this medication due to risk of GI bleeding.  She can use antihistamine, Flonase, Mucinex for additional symptom relief.  She is to rest and drink plenty of fluid.  Recommend she follow-up with her primary care provider for further evaluation and management.  If she has any worsening symptoms including fever not respond to medication, chest pain, shortness of breath, nausea/vomiting or Prothero intake, worsening cough, weakness she is to be seen immediately to which she expressed understanding.  Final Clinical Impressions(s) / UC Diagnoses   Final diagnoses:  Bronchospasm  Acute cough     Discharge Instructions      I believe that you have asthma.  Use albuterol inhaler every 4-6 hours as needed.  Start prednisone taper.  Do not take NSAIDs including aspirin, ibuprofen/Advil, naproxen/Aleve with this medication.  Follow-up with your PCP as scheduled.  If anything worsens go to the emergency room.     ED Prescriptions     Medication Sig Dispense Auth. Provider   predniSONE (STERAPRED UNI-PAK 21 TAB) 10 MG (21) TBPK tablet As directed 21 tablet Jorge Retz K, PA-C      PDMP not reviewed this encounter.   Jeani HawkingRaspet, Amelie Caracci K, PA-C 06/28/21 1849    RaspetNoberto Retort, Timonthy Hovater K, PA-C 06/28/21 1850

## 2021-09-04 ENCOUNTER — Other Ambulatory Visit (HOSPITAL_BASED_OUTPATIENT_CLINIC_OR_DEPARTMENT_OTHER): Payer: Self-pay | Admitting: Family Medicine

## 2021-09-04 DIAGNOSIS — E039 Hypothyroidism, unspecified: Secondary | ICD-10-CM

## 2021-10-13 ENCOUNTER — Ambulatory Visit (HOSPITAL_BASED_OUTPATIENT_CLINIC_OR_DEPARTMENT_OTHER)
Admission: RE | Admit: 2021-10-13 | Discharge: 2021-10-13 | Disposition: A | Discharge status: Home / Self Care | Payer: Medicaid Other | Source: Ambulatory Visit | Attending: Family Medicine | Admitting: Family Medicine

## 2021-10-13 DIAGNOSIS — E039 Hypothyroidism, unspecified: Secondary | ICD-10-CM | POA: Diagnosis present

## 2022-05-24 ENCOUNTER — Other Ambulatory Visit: Payer: Self-pay

## 2022-05-24 ENCOUNTER — Emergency Department (HOSPITAL_BASED_OUTPATIENT_CLINIC_OR_DEPARTMENT_OTHER)
Admission: EM | Admit: 2022-05-24 | Discharge: 2022-05-25 | Disposition: A | Discharge status: Home / Self Care | Payer: Medicaid Other | Attending: Emergency Medicine | Admitting: Emergency Medicine

## 2022-05-24 ENCOUNTER — Encounter (HOSPITAL_BASED_OUTPATIENT_CLINIC_OR_DEPARTMENT_OTHER): Payer: Self-pay

## 2022-05-24 DIAGNOSIS — U071 COVID-19: Secondary | ICD-10-CM | POA: Diagnosis not present

## 2022-05-24 DIAGNOSIS — R509 Fever, unspecified: Secondary | ICD-10-CM | POA: Diagnosis present

## 2022-05-24 DIAGNOSIS — D649 Anemia, unspecified: Secondary | ICD-10-CM | POA: Diagnosis not present

## 2022-05-24 DIAGNOSIS — E039 Hypothyroidism, unspecified: Secondary | ICD-10-CM | POA: Diagnosis not present

## 2022-05-24 NOTE — ED Triage Notes (Addendum)
Last week was dx with strep throat & bilateral ear infections Fever continues and sore throat, cough and ear pain Pt has not finished her antibiotics Has taken tylenol PTA

## 2022-05-25 LAB — RESP PANEL BY RT-PCR (RSV, FLU A&B, COVID)  RVPGX2
Influenza A by PCR: NEGATIVE
Influenza B by PCR: NEGATIVE
Resp Syncytial Virus by PCR: NEGATIVE
SARS Coronavirus 2 by RT PCR: POSITIVE — AB

## 2022-05-25 MED ORDER — IBUPROFEN 400 MG PO TABS
600.0000 mg | ORAL_TABLET | Freq: Once | ORAL | Status: AC
  Administered 2022-05-25: 600 mg via ORAL
  Filled 2022-05-25: qty 1

## 2022-05-25 MED ORDER — GUAIFENESIN-CODEINE 100-10 MG/5ML PO SOLN: 10.0000 mL | Freq: Four times a day (QID) | ORAL | 0 refills | Status: DC | PRN

## 2022-05-25 NOTE — ED Provider Notes (Signed)
[place] EMERGENCY DEPARTMENT AT [facility] Provider Note   CSN: [id number] Arrival date & time: 05/24/22  2315     History  Chief Complaint  Patient presents with   Fever   Sore Throat    Casey Obrien is a 32 y.o. female.  Patient is a 32 year old female with history of hypothyroidism and anemia.  Patient presenting today with complaints of sore throat, congestion, and fever.  This has been worsening over the past week.  She was prescribed amoxicillin for a throat infection and bilateral ear infections by her primary doctor.  This evening she developed fever and bodyaches and presents for evaluation of this.  She does describe a cough along with some burning in her throat and chest when she coughs.  She denies any difficulty breathing.  The history is provided by the patient.       Home Medications Prior to Admission medications   Medication Sig Start Date End Date Taking? Authorizing Provider  benzonatate (TESSALON) 100 MG capsule Take 1 capsule (100 mg total) by mouth every 8 (eight) hours. 06/24/21   White, Elita Boone, NP  cyclobenzaprine (FLEXERIL) 10 MG tablet Take 1 tablet (10 mg total) by mouth at bedtime. 05/15/21   Domenick Gong, MD  levothyroxine (SYNTHROID) 50 MCG tablet Take 1 tablet by mouth daily. 04/30/19   [provider]  naproxen (NAPROSYN) 500 MG tablet Take 1 tablet (500 mg total) by mouth 2 (two) times daily. 05/15/21   Domenick Gong, MD  neomycin-polymyxin-hydrocortisone (CORTISPORIN) 3.5-10000-1 OTIC suspension Place 4 drops into the right ear 3 (three) times daily. 06/24/21   White, Elita Boone, NP  predniSONE (STERAPRED UNI-PAK 21 TAB) 10 MG (21) TBPK tablet As directed 06/28/21   Raspet, Erin K, PA-C  promethazine-dextromethorphan (PROMETHAZINE-DM) 6.25-15 MG/5ML syrup Take 5 mLs by mouth at bedtime as needed for cough. 06/24/21   Valinda Hoar, NP  iron polysaccharides (NIFEREX) 150 MG capsule Take 1 capsule (150 mg total)  by mouth daily. Patient not taking: Reported on 01/25/2018 05/01/17 02/21/19  Donette Larry, CNM      Allergies    Patient has no known allergies.    Review of Systems   Review of Systems  All other systems reviewed and are negative.   Physical Exam Updated Vital Signs BP 117/86   Pulse (!) 145   Temp (!) 101.1 F (38.4 C)   Resp 20   Ht 5\' 2"  (1.575 m)   Wt 72.1 kg   LMP 04/28/2022 (Exact Date)   SpO2 100%   BMI 29.08 kg/m  Physical Exam Vitals and nursing note reviewed.  Constitutional:      General: She is not in acute distress.    Appearance: She is well-developed. She is not diaphoretic.  HENT:     Head: Normocephalic and atraumatic.     Mouth/Throat:     Mouth: Mucous membranes are moist.     Pharynx: Posterior oropharyngeal erythema present. No oropharyngeal exudate.     Tonsils: No tonsillar exudate or tonsillar abscesses.  Cardiovascular:     Rate and Rhythm: Normal rate and regular rhythm.     Heart sounds: No murmur heard.    No friction rub. No gallop.  Pulmonary:     Effort: Pulmonary effort is normal. No respiratory distress.     Breath sounds: Normal breath sounds. No wheezing.  Abdominal:     General: Bowel sounds are normal. There is no distension.     Palpations: Abdomen is soft.  Tenderness: There is no abdominal tenderness.  Musculoskeletal:        General: Normal range of motion.     Cervical back: Normal range of motion and neck supple.  Skin:    General: Skin is warm and dry.  Neurological:     General: No focal deficit present.     Mental Status: She is alert and oriented to person, place, and time.     ED Results / Procedures / Treatments   Labs (all labs ordered are listed, but only abnormal results are displayed) Labs Reviewed  RESP PANEL BY RT-PCR (RSV, FLU A&B, COVID)  [id number]    EKG None  Radiology No results found.  Procedures Procedures    Medications Ordered in ED Medications - No data to display  ED  Course/ Medical Decision Making/ A&P  Patient is a 32 year old female presenting with sore throat, congestion, and fever over the past week.  She is currently taking amoxicillin for a throat infection.  Patient arrives here with stable vital signs, but is tachycardic with a heart rate of 140.  Temperature is 101.1.  Nasal swab obtained testing positive for COVID-19.  Patient has been observed in the ER and her heart rate has improved after receiving Motrin.  Patient is nontoxic in appearance.  There is no hypoxia and she is in no respiratory distress.  At this point I feel as though she can safely be discharged with over-the-counter medications, rest, and follow-up as needed.  Final Clinical Impression(s) / ED Diagnoses Final diagnoses:  None    Rx / DC Orders ED Discharge Orders     None         Geoffery Lyons, MD 05/25/22 [phone]

## 2022-05-25 NOTE — Discharge Instructions (Signed)
Take over-the-counter medications as needed for relief of symptoms.  Begin taking Robitussin with codeine as prescribed as needed for cough.  Drink plenty of fluids and get plenty of rest.  Quarantine at home for the next 5 days or until you are no longer symptomatic.  Return to the emergency department if your symptoms significantly worsen or change.

## 2022-05-27 ENCOUNTER — Emergency Department (HOSPITAL_BASED_OUTPATIENT_CLINIC_OR_DEPARTMENT_OTHER)
Admission: EM | Admit: 2022-05-27 | Discharge: 2022-05-27 | Disposition: A | Discharge status: Home / Self Care | Payer: Medicaid Other | Attending: Emergency Medicine | Admitting: Emergency Medicine

## 2022-05-27 ENCOUNTER — Encounter (HOSPITAL_BASED_OUTPATIENT_CLINIC_OR_DEPARTMENT_OTHER): Payer: Self-pay

## 2022-05-27 ENCOUNTER — Other Ambulatory Visit: Payer: Self-pay

## 2022-05-27 ENCOUNTER — Emergency Department (HOSPITAL_BASED_OUTPATIENT_CLINIC_OR_DEPARTMENT_OTHER): Payer: Medicaid Other | Admitting: Radiology

## 2022-05-27 DIAGNOSIS — U071 COVID-19: Secondary | ICD-10-CM | POA: Diagnosis not present

## 2022-05-27 DIAGNOSIS — R051 Acute cough: Secondary | ICD-10-CM

## 2022-05-27 DIAGNOSIS — R509 Fever, unspecified: Secondary | ICD-10-CM | POA: Diagnosis present

## 2022-05-27 LAB — CBC WITH DIFFERENTIAL/PLATELET
Abs Immature Granulocytes: 0.01 10*3/uL (ref 0.00–0.07)
Basophils Absolute: 0 10*3/uL (ref 0.0–0.1)
Basophils Relative: 0 %
Eosinophils Absolute: 0.1 10*3/uL (ref 0.0–0.5)
Eosinophils Relative: 1 %
HCT: 38.3 % (ref 36.0–46.0)
Hemoglobin: 12.5 g/dL (ref 12.0–15.0)
Immature Granulocytes: 0 %
Lymphocytes Relative: 41 %
Lymphs Abs: 2.8 10*3/uL (ref 0.7–4.0)
MCH: 26.6 pg (ref 26.0–34.0)
MCHC: 32.6 g/dL (ref 30.0–36.0)
MCV: 81.5 fL (ref 80.0–100.0)
Monocytes Absolute: 0.6 10*3/uL (ref 0.1–1.0)
Monocytes Relative: 8 %
Neutro Abs: 3.3 10*3/uL (ref 1.7–7.7)
Neutrophils Relative %: 50 %
Platelets: 212 10*3/uL (ref 150–400)
RBC: 4.7 MIL/uL (ref 3.87–5.11)
RDW: 13.9 % (ref 11.5–15.5)
WBC: 6.7 10*3/uL (ref 4.0–10.5)
nRBC: 0 % (ref 0.0–0.2)

## 2022-05-27 LAB — BASIC METABOLIC PANEL
Anion gap: 8 (ref 5–15)
BUN: 17 mg/dL (ref 6–20)
CO2: 27 mmol/L (ref 22–32)
Calcium: 8.7 mg/dL — ABNORMAL LOW (ref 8.9–10.3)
Chloride: 102 mmol/L (ref 98–111)
Creatinine, Ser: 1.12 mg/dL — ABNORMAL HIGH (ref 0.44–1.00)
GFR, Estimated: >60 mL/min (ref >60)
Glucose, Bld: 88 mg/dL (ref 70–99)
Potassium: 3.5 mmol/L (ref 3.5–5.1)
Sodium: 137 mmol/L (ref 135–145)

## 2022-05-27 LAB — PREGNANCY, URINE: Preg Test, Ur: NEGATIVE

## 2022-05-27 MED ORDER — IBUPROFEN 400 MG PO TABS
600.0000 mg | ORAL_TABLET | Freq: Once | ORAL | Status: AC
  Administered 2022-05-27: 600 mg via ORAL
  Filled 2022-05-27: qty 1

## 2022-05-27 MED ORDER — BENZONATATE 100 MG PO CAPS: 100.0000 mg | ORAL_CAPSULE | Freq: Three times a day (TID) | ORAL | 0 refills | Status: DC

## 2022-05-27 NOTE — ED Provider Notes (Signed)
[place] EMERGENCY DEPARTMENT AT [facility] Provider Note   CSN: [id number] Arrival date & time: 05/27/22  1548     History  Chief Complaint  Patient presents with   Fever    Casey Obrien is a 32 y.o. female, history of COVID, who presents to the ED secondary to persistent cough, sore throat, and fevers for the last 5 days.  She states that about 2 weeks ago she was diagnosed with an ear infection and started on some antibiotics, still has persistent symptoms so came back to the ER, and was told that she had COVID about 5 days ago.  Notes that since then she has had persistent fevers even though she is taking Tylenol every 4 hours, and that she just feels very fatigued.  Has not had any nausea, vomiting, diarrhea.  Just states the sore throat, congestion, ear pain, is the persistent issue.  Also would like to know if she is pregnant.  Last menstrual period 4/2.  Home Medications Prior to Admission medications   Medication Sig Start Date End Date Taking? Authorizing Provider  benzonatate (TESSALON) 100 MG capsule Take 1 capsule (100 mg total) by mouth every 8 (eight) hours. 05/27/22  Yes Ishia Tenorio L, PA  cyclobenzaprine (FLEXERIL) 10 MG tablet Take 1 tablet (10 mg total) by mouth at bedtime. 05/15/21   Domenick Gong, MD  guaiFENesin-codeine 100-10 MG/5ML syrup Take 10 mLs by mouth every 6 (six) hours as needed for cough. 05/25/22   Geoffery Lyons, MD  levothyroxine (SYNTHROID) 50 MCG tablet Take 1 tablet by mouth daily. 04/30/19   [provider]  naproxen (NAPROSYN) 500 MG tablet Take 1 tablet (500 mg total) by mouth 2 (two) times daily. 05/15/21   Domenick Gong, MD  neomycin-polymyxin-hydrocortisone (CORTISPORIN) 3.5-10000-1 OTIC suspension Place 4 drops into the right ear 3 (three) times daily. 06/24/21   White, Elita Boone, NP  predniSONE (STERAPRED UNI-PAK 21 TAB) 10 MG (21) TBPK tablet As directed 06/28/21   Raspet, Erin K, PA-C   promethazine-dextromethorphan (PROMETHAZINE-DM) 6.25-15 MG/5ML syrup Take 5 mLs by mouth at bedtime as needed for cough. 06/24/21   Valinda Hoar, NP  iron polysaccharides (NIFEREX) 150 MG capsule Take 1 capsule (150 mg total) by mouth daily. Patient not taking: Reported on 01/25/2018 05/01/17 02/21/19  Donette Larry, CNM      Allergies    Patient has no known allergies.    Review of Systems   Review of Systems  Constitutional:  Positive for fatigue and fever.  Respiratory:  Negative for shortness of breath.     Physical Exam Updated Vital Signs BP 103/69 (BP Location: Right Arm)   Pulse 94   Temp (!) 100.4 F (38 C) (Oral)   Resp 16   Ht 5\' 2"  (1.575 m)   Wt 72.1 kg   LMP 04/28/2022 (Exact Date)   SpO2 98%   BMI 29.07 kg/m  Physical Exam Vitals and nursing note reviewed.  Constitutional:      General: She is not in acute distress.    Appearance: She is well-developed.  HENT:     Head: Normocephalic and atraumatic.  Eyes:     Conjunctiva/sclera: Conjunctivae normal.  Cardiovascular:     Rate and Rhythm: Normal rate and regular rhythm.     Heart sounds: No murmur heard. Pulmonary:     Effort: Pulmonary effort is normal. No respiratory distress.     Breath sounds: Normal breath sounds.  Abdominal:     Palpations: Abdomen is  soft.     Tenderness: There is no abdominal tenderness.  Musculoskeletal:        General: No swelling.     Cervical back: Neck supple.  Skin:    General: Skin is warm and dry.     Capillary Refill: Capillary refill takes less than 2 seconds.  Neurological:     Mental Status: She is alert.  Psychiatric:        Mood and Affect: Mood normal.     ED Results / Procedures / Treatments   Labs (all labs ordered are listed, but only abnormal results are displayed) Labs Reviewed  BASIC METABOLIC PANEL - Abnormal; Notable for the following components:      Result Value   Creatinine, Ser 1.12 (*)    Calcium 8.7 (*)    All other components  within normal limits  PREGNANCY, URINE  CBC WITH DIFFERENTIAL/PLATELET    EKG None  Radiology DG Chest 2 View  Result Date: 05/27/2022 CLINICAL DATA:  eval for pneumonia; covid+ EXAM: CHEST - 2 VIEW COMPARISON:  Chest x-ray June 3, 23. FINDINGS: No consolidation. No visible pleural effusions or pneumothorax. Cardiomediastinal silhouette is within normal limits. No acute osseous abnormality. IMPRESSION: No active cardiopulmonary disease. Electronically Signed   By: Feliberto Harts M.D.   On: 05/27/2022 17:44    Procedures Procedures    Medications Ordered in ED Medications  ibuprofen (ADVIL) tablet 600 mg (600 mg Oral Given 05/27/22 1654)    ED Course/ Medical Decision Making/ A&P                             Medical Decision Making Patient is a 32 year old female, here for COVID symptoms.  She states she is diagnosed with COVID, and she persistently has a fever, her Tylenol every 4 hours is not cutting it.  She has not taken any ibuprofen.  States she has persistent ear pain, and cough.  We will obtain a chest x-ray for further evaluation for possible pneumonia, and basic labs to evaluate for any electrolyte abnormalities.  She is overall well-appearing on exam.  Amount and/or Complexity of Data Reviewed Labs: ordered.    Details: Labs are unremarkable Radiology: ordered.    Details: Chest x-ray clear Discussion of management or test interpretation with external provider(s): Discussed with patient, she has a persistent cough, ear pain, her TMs are within normal limits, and there shows no evidence of any kind of otitis externa.  She has no swelling along her ears, or mastoid tenderness.  I think this likely secondary to eustachian tube dysfunction/viral illness from COVID.  We discussed conservative care, for ear pain at home.  Additionally she has persistent cough, and is COVID-positive.  She has been taking cough syrup without any relief.  We will start her on some Tessalon Perles to  help with this.  Additionally I discussed return precautions, and we discussed that she can use ibuprofen, as long as she does not have any kidney issues, or gelatin allergy.  Risk Prescription drug management.   Final Clinical Impression(s) / ED Diagnoses Final diagnoses:  COVID-19  Acute cough    Rx / DC Orders ED Discharge Orders          Ordered    benzonatate (TESSALON) 100 MG capsule  Every 8 hours        05/27/22 1857              Diani Jillson, Harley Alto, PA  05/27/22 1901    Maia Plan, MD 05/28/22 1515

## 2022-05-27 NOTE — ED Notes (Signed)
Discharge paperwork given and verbally understood. 

## 2022-05-27 NOTE — Discharge Instructions (Addendum)
Please follow-up with your primary care doctor, you can take the cough medicine as needed for your cough.  Make sure you drink lots of fluids, and rest.  Return to the ER if you have severe shortness of breath, intractable nausea, vomiting.

## 2022-05-27 NOTE — ED Notes (Signed)
Tylenol @ 1330 per pt

## 2022-05-27 NOTE — ED Triage Notes (Addendum)
Patient here POV from Home.  Endorses Fever, Cough, Chills, Aches that began over the past week. Seeks Evaluation for continued symptoms. Recently diagnosed with COVID-19 a few days. Prior to that, Patient was at [facility] and was taking Antibiotics for Ear Infections.   Interpreter (Arabic) utilized for Triage.   NAD Noted during Triage. A&Ox4. Gcs 15. Ambulatory.

## 2022-08-07 ENCOUNTER — Emergency Department (HOSPITAL_BASED_OUTPATIENT_CLINIC_OR_DEPARTMENT_OTHER)
Admission: EM | Admit: 2022-08-07 | Discharge: 2022-08-07 | Disposition: A | Discharge status: Home / Self Care | Payer: Medicaid Other | Attending: Emergency Medicine | Admitting: Emergency Medicine

## 2022-08-07 ENCOUNTER — Other Ambulatory Visit: Payer: Self-pay

## 2022-08-07 ENCOUNTER — Encounter (HOSPITAL_BASED_OUTPATIENT_CLINIC_OR_DEPARTMENT_OTHER): Payer: Self-pay | Admitting: Emergency Medicine

## 2022-08-07 DIAGNOSIS — R059 Cough, unspecified: Secondary | ICD-10-CM | POA: Diagnosis present

## 2022-08-07 DIAGNOSIS — J069 Acute upper respiratory infection, unspecified: Secondary | ICD-10-CM | POA: Diagnosis not present

## 2022-08-07 MED ORDER — ONDANSETRON HCL 4 MG/2ML IJ SOLN
4.0000 mg | Freq: Once | INTRAMUSCULAR | Status: AC
  Administered 2022-08-07: 4 mg via INTRAVENOUS
  Filled 2022-08-07: qty 2

## 2022-08-07 MED ORDER — ONDANSETRON 4 MG PO TBDP: ORAL_TABLET | ORAL | 0 refills | Status: DC

## 2022-08-07 MED ORDER — MECLIZINE HCL 25 MG PO TABS
25.0000 mg | ORAL_TABLET | Freq: Once | ORAL | Status: AC
  Administered 2022-08-07: 25 mg via ORAL
  Filled 2022-08-07: qty 1

## 2022-08-07 NOTE — ED Provider Notes (Signed)
[place] EMERGENCY DEPARTMENT AT [facility] Provider Note   CSN: [id number] Arrival date & time: 08/07/22  2135     History  No chief complaint on file.   Casey Obrien is a 32 y.o. female.  32 yo F with a chief complaint of a cough.  This been going on for about 10 days.  She has tried her inhaler at home without improvement.  She went to urgent care today and was prescribed a steroid Dosepak azithromycin and hydrocodone cough syrup.  She took all 3 of these together and then became nauseated and threw up.  She then decided come to the emergency department for evaluation.        Home Medications Prior to Admission medications   Medication Sig Start Date End Date Taking? Authorizing Provider  ondansetron (ZOFRAN-ODT) 4 MG disintegrating tablet 4mg  ODT q4 hours prn nausea/vomit 08/07/22  Yes Melene Plan, DO  benzonatate (TESSALON) 100 MG capsule Take 1 capsule (100 mg total) by mouth every 8 (eight) hours. 05/27/22   Small, Brooke L, PA  cyclobenzaprine (FLEXERIL) 10 MG tablet Take 1 tablet (10 mg total) by mouth at bedtime. 05/15/21   Domenick Gong, MD  guaiFENesin-codeine 100-10 MG/5ML syrup Take 10 mLs by mouth every 6 (six) hours as needed for cough. 05/25/22   Geoffery Lyons, MD  levothyroxine (SYNTHROID) 50 MCG tablet Take 1 tablet by mouth daily. 04/30/19   [provider]  naproxen (NAPROSYN) 500 MG tablet Take 1 tablet (500 mg total) by mouth 2 (two) times daily. 05/15/21   Domenick Gong, MD  neomycin-polymyxin-hydrocortisone (CORTISPORIN) 3.5-10000-1 OTIC suspension Place 4 drops into the right ear 3 (three) times daily. 06/24/21   White, Elita Boone, NP  predniSONE (STERAPRED UNI-PAK 21 TAB) 10 MG (21) TBPK tablet As directed 06/28/21   Raspet, Erin K, PA-C  promethazine-dextromethorphan (PROMETHAZINE-DM) 6.25-15 MG/5ML syrup Take 5 mLs by mouth at bedtime as needed for cough. 06/24/21   Valinda Hoar, NP  iron polysaccharides (NIFEREX) 150 MG  capsule Take 1 capsule (150 mg total) by mouth daily. Patient not taking: Reported on 01/25/2018 05/01/17 02/21/19  Donette Larry, CNM      Allergies    Patient has no known allergies.    Review of Systems   Review of Systems  Physical Exam Updated Vital Signs BP 120/85   Pulse 86   Temp 99 F (37.2 C) (Oral)   Ht 5\' 2"  (1.575 m)   Wt 73 kg   SpO2 100%   BMI 29.44 kg/m  Physical Exam Vitals and nursing note reviewed.  Constitutional:      General: She is not in acute distress.    Appearance: She is well-developed. She is not diaphoretic.  HENT:     Head: Normocephalic and atraumatic.     Comments: Swollen turbinates, posterior nasal drip, tm normal bilaterally.   Eyes:     Pupils: Pupils are equal, round, and reactive to light.  Cardiovascular:     Rate and Rhythm: Normal rate and regular rhythm.     Heart sounds: No murmur heard.    No friction rub. No gallop.  Pulmonary:     Effort: Pulmonary effort is normal.     Breath sounds: No wheezing or rales.  Abdominal:     General: There is no distension.     Palpations: Abdomen is soft.     Tenderness: There is no abdominal tenderness.  Musculoskeletal:        General: No tenderness.  Cervical back: Normal range of motion and neck supple.  Skin:    General: Skin is warm and dry.  Neurological:     Mental Status: She is alert and oriented to person, place, and time.  Psychiatric:        Behavior: Behavior normal.     ED Results / Procedures / Treatments   Labs (all labs ordered are listed, but only abnormal results are displayed) Labs Reviewed - No data to display  EKG None  Radiology No results found.  Procedures Procedures    Medications Ordered in ED Medications  ondansetron (ZOFRAN) injection 4 mg (has no administration in time range)  meclizine (ANTIVERT) tablet 25 mg (has no administration in time range)    ED Course/ Medical Decision Making/ A&P                             Medical  Decision Making Risk Prescription drug management.   32 yo F with a chief complaints of a cough.  This has been going on for about 10 days.  She went to urgent care and was prescribed a steroid Dosepak azithromycin and hydrocodone cough syrup and she ended up vomiting so she came here for evaluation.  She is well-appearing nontoxic.  Appears well-hydrated.  No bacterial source found on my exam.  Is clear lung sounds for me.  Given oral antiemetics here without issue.  Will discharge home.  PCP follow-up.  On my record review the patient does have a history of chronic cough and has seen allergy for this in the past.  I am not sure that she would benefit from any of the medication started in urgent care but encouraged her to complete her antibiotics.  If she felt she was having trouble with wheezing then finishing the steroid Dosepak might be helpful.  I cautioned her to not take the cough syrup unless she felt she needed it.  Will have her follow-up with her family doctor in the office.  10:10 PM:  I have discussed the diagnosis/risks/treatment options with the patient.  Evaluation and diagnostic testing in the emergency department does not suggest an emergent condition requiring admission or immediate intervention beyond what has been performed at this time.  They will follow up with PCP. We also discussed returning to the ED immediately if new or worsening sx occur. We discussed the sx which are most concerning (e.g., sudden worsening pain, fever, inability to tolerate by mouth) that necessitate immediate return. Medications administered to the patient during their visit and any new prescriptions provided to the patient are listed below.  Medications given during this visit Medications  ondansetron (ZOFRAN) injection 4 mg (has no administration in time range)  meclizine (ANTIVERT) tablet 25 mg (has no administration in time range)     The patient appears reasonably screen and/or stabilized for  discharge and I doubt any other medical condition or other [facility] requiring further screening, evaluation, or treatment in the ED at this time prior to discharge.          Final Clinical Impression(s) / ED Diagnoses Final diagnoses:  Viral URI with cough    Rx / DC Orders ED Discharge Orders          Ordered    ondansetron (ZOFRAN-ODT) 4 MG disintegrating tablet        08/07/22 2203              Melene Plan, DO 08/07/22  2210  

## 2022-08-07 NOTE — Discharge Instructions (Addendum)
Take tylenol 2 pills 4 times a day and motrin 4 pills 3 times a day.  Drink plenty of fluids.  Return for worsening shortness of breath, headache, confusion. Follow up with your family doctor.   ????? ???????? 2 ??? 4 ???? ????? ??????? 4 ??? 3 ???? ?????. ??? ?????? ?? ???????. ?????? ?????? ??? ?????? ??????? ?????????. ???? ?? ???? ???????.

## 2022-08-07 NOTE — ED Notes (Signed)
RN reviewed discharge instructions with pt. Pt verbalized understanding and had no further questions. VSS upon discharge.  

## 2022-08-07 NOTE — ED Triage Notes (Signed)
Arabic Interpretor Nael [phone]  Patient c/o cough x 10 days, unrelieved by inhaler and cough meds.  Patient seen at [facility] today for same and states that after taking cough meds prescribed at [facility], she starting vomiting.  Patient also endorses abdominal pain, sore throat and headache.

## 2022-10-08 ENCOUNTER — Other Ambulatory Visit: Payer: Self-pay | Admitting: Physician Assistant

## 2022-10-08 ENCOUNTER — Ambulatory Visit
Admission: RE | Admit: 2022-10-08 | Discharge: 2022-10-08 | Disposition: A | Discharge status: Home / Self Care | Payer: Medicaid Other | Source: Ambulatory Visit | Attending: Physician Assistant | Admitting: Physician Assistant

## 2022-10-08 DIAGNOSIS — M25571 Pain in right ankle and joints of right foot: Secondary | ICD-10-CM

## 2022-10-08 DIAGNOSIS — J4520 Mild intermittent asthma, uncomplicated: Secondary | ICD-10-CM | POA: Insufficient documentation

## 2022-10-15 DIAGNOSIS — J4530 Mild persistent asthma, uncomplicated: Secondary | ICD-10-CM | POA: Insufficient documentation

## 2022-12-02 ENCOUNTER — Other Ambulatory Visit (HOSPITAL_COMMUNITY)
Admission: RE | Admit: 2022-12-02 | Discharge: 2022-12-02 | Disposition: A | Discharge status: Home / Self Care | Payer: Medicaid Other | Source: Ambulatory Visit

## 2022-12-02 ENCOUNTER — Ambulatory Visit: Payer: Medicaid Other

## 2022-12-02 VITALS — BP 120/77 | HR 83 | Wt 160.8 lb

## 2022-12-02 DIAGNOSIS — N898 Other specified noninflammatory disorders of vagina: Secondary | ICD-10-CM | POA: Diagnosis not present

## 2022-12-02 DIAGNOSIS — Z975 Presence of (intrauterine) contraceptive device: Secondary | ICD-10-CM

## 2022-12-02 DIAGNOSIS — Z1239 Encounter for other screening for malignant neoplasm of breast: Secondary | ICD-10-CM

## 2022-12-02 DIAGNOSIS — Z01419 Encounter for gynecological examination (general) (routine) without abnormal findings: Secondary | ICD-10-CM | POA: Insufficient documentation

## 2022-12-02 DIAGNOSIS — Z124 Encounter for screening for malignant neoplasm of cervix: Secondary | ICD-10-CM

## 2022-12-02 NOTE — Progress Notes (Unsigned)
GYNECOLOGY OFFICE VISIT NOTE-WELL WOMAN EXAM  History:   Casey Obrien is a 32 year old here today for annual visit. She reports some vaginal itching that occurs prior to and after her menstrual cycle.  She also reports some burning during sex.   Patient reports her LMP was 10/11 and was "normal flow."  Patient states she uses 4 pads/day and has no clots, but cramping that is relieved with tylenol and ibuprofen.  Patient states her cycles lasts 7 days.  She reports for the past 3 months she has had vaginal itching before and after that she has not attempted to treat.   Patient states for the past 2-3 weeks she has had burning during sex.  She denies usage of lubrications.  She states she also has urinary burning, immediately after sex, but none with subsequent outputs.    Birth Control:  Paragard IUD; Satisfied  Reproductive Concerns Sexually Active: Yes Partners Type: Female Number of partners in last year: One-Husband STD Testing: Declines  Obstetrical History: G2P2002 , H/O C/S x 2 Gynecological History: Denies history of abnormal paps Vaginal/GU Concerns: As above.  Breast Concerns/Exams: None, reports performing breast exams. Patient denies family history of breast, uterine, cervical, or ovarian cancer.  Medical and Nutrition PCP: [facility]. Last appt Oct 16th Significant PMx: Appendectomy 20 years ago. No current medical hx. 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 Endorses safety. DV/A: Denies Social Support: Endorses Employment: Technical brewer  Past Medical History:  Diagnosis Date   Medical history non-contributory    Thyroid disease     Past Surgical History:  Procedure Laterality Date   APPENDECTOMY     CESAREAN SECTION     cervix not dilating    CESAREAN SECTION N/A 04/29/2017   Procedure: REPEAT CESAREAN SECTION;   Surgeon: Willodean Rosenthal, MD;  Location: [facility];  Service: Obstetrics;  Laterality: N/A;   CESAREAN SECTION     TONSILLECTOMY      The following portions of the patient's history were reviewed and updated as appropriate: allergies, current medications, past family history, past medical history, past social history, past surgical history and problem list.   Health Maintenance: Pap: Collected today, Results Pending.  Mammogram: N/A d/t age.  Colonoscopy: N/A d/t age Review of Systems:  Pertinent items noted in HPI and remainder of comprehensive ROS otherwise negative.    Objective:    Physical Exam BP 120/77   Pulse 83   Wt 160 lb 12.8 oz (72.9 kg)   LMP 11/06/2022 (Exact Date)   BMI 29.41 kg/m  Physical Exam Vitals reviewed. Exam conducted with a chaperone present Lamar Laundry, MA).  Constitutional:      Appearance: Normal appearance.  HENT:     Head: Normocephalic and atraumatic.  Eyes:     Conjunctiva/sclera: Conjunctivae normal.  Cardiovascular:     Rate and Rhythm: Normal rate.  Pulmonary:     Effort: Pulmonary effort is normal. No respiratory distress.  Genitourinary:    General: Normal vulva.     Labia:        Right: No tenderness or lesion.        Left: No tenderness or lesion.      Vagina: Vaginal discharge present.  Musculoskeletal:        General: Normal range of motion.     Cervical back: Normal range of motion.  Skin:  General: Skin is warm and dry.  Neurological:     Mental Status: She is alert and oriented to person, place, and time.  Psychiatric:        Mood and Affect: Mood normal.        Behavior: Behavior normal.      Labs and Imaging No results found for this or any previous visit (from the past 168 hour(s)). No results found.   Assessment & Plan:  32 year old 1. Well woman exam with routine gynecological exam -Exam performed and findings discussed. -Informed of turnover time and provider/clinic policy on releasing  results. -Encouraged to activate and utilize Mychart for reviewing of results, communication with office, and scheduling of appts. -Educated on AHA exercise recommendations of 30 minutes of moderate to vigorous activity at least 5x/week.  2. Pap smear for cervical cancer screening -Pap collected.  -Educated on ASCCP guidelines regarding pap smear evaluation and frequency. -Cytology - PAP( [facility])  3. Encounter for screening breast examination -CBE completed and normal.  -Discussed continued self breast exams with increased breast awareness. -Start routine mammogram screenings at age 32.   4. Vaginal itching -Wet prep collected -Will treat accordingly  5. IUD (intrauterine device) in place -IUD strings visualized.  Routine preventative health maintenance measures emphasized. Please refer to After Visit Summary for other counseling recommendations.   No follow-ups on file.      Cherre Robins, CNM 12/02/2022

## 2022-12-03 LAB — CERVICOVAGINAL ANCILLARY ONLY
Bacterial Vaginitis (gardnerella): NEGATIVE
Candida Glabrata: NEGATIVE
Candida Vaginitis: NEGATIVE
Comment: NEGATIVE
Comment: NEGATIVE
Comment: NEGATIVE

## 2022-12-07 DIAGNOSIS — R87610 Atypical squamous cells of undetermined significance on cytologic smear of cervix (ASC-US): Secondary | ICD-10-CM | POA: Insufficient documentation

## 2022-12-07 LAB — CYTOLOGY - PAP
Adequacy: ABSENT
Comment: NEGATIVE
Diagnosis: UNDETERMINED — AB
High risk HPV: NEGATIVE

## 2023-01-26 ENCOUNTER — Emergency Department (HOSPITAL_BASED_OUTPATIENT_CLINIC_OR_DEPARTMENT_OTHER): Payer: Medicaid Other

## 2023-01-26 ENCOUNTER — Emergency Department (HOSPITAL_BASED_OUTPATIENT_CLINIC_OR_DEPARTMENT_OTHER)
Admission: EM | Admit: 2023-01-26 | Discharge: 2023-01-26 | Disposition: A | Discharge status: Home / Self Care | Payer: Medicaid Other | Attending: Emergency Medicine | Admitting: Emergency Medicine

## 2023-01-26 ENCOUNTER — Other Ambulatory Visit: Payer: Self-pay

## 2023-01-26 ENCOUNTER — Encounter (HOSPITAL_BASED_OUTPATIENT_CLINIC_OR_DEPARTMENT_OTHER): Payer: Self-pay | Admitting: Emergency Medicine

## 2023-01-26 DIAGNOSIS — M5442 Lumbago with sciatica, left side: Secondary | ICD-10-CM | POA: Diagnosis not present

## 2023-01-26 DIAGNOSIS — M79672 Pain in left foot: Secondary | ICD-10-CM | POA: Diagnosis not present

## 2023-01-26 DIAGNOSIS — M545 Low back pain, unspecified: Secondary | ICD-10-CM | POA: Diagnosis present

## 2023-01-26 NOTE — Discharge Instructions (Signed)
 Follow back up with your primary care doctor regarding the back pain which is probably sciatic in nature.  Also make an appointment to follow-up with [facility] for the foreign body in the third toe.  They will decide whether it is best to remove it or to allow it to get walled off and come out on its own.  Return for any signs of foot infection.

## 2023-01-26 NOTE — ED Triage Notes (Signed)
 3 weeks ago son was ill, had to sleep with him in weird position.  Now complains of back pain Seen by PCP and told may need PT if not improved  Also reports left middle toe injury about 18 days ago, continued pain

## 2023-01-26 NOTE — ED Provider Notes (Addendum)
 [place] EMERGENCY DEPARTMENT AT [facility] Provider Note   CSN: [id number] Arrival date & time: 01/26/23  1454     History  Chief Complaint  Patient presents with   Back Pain   Leg Pain    Back pain, leg pain and foot pain    Casey Obrien is a 32 y.o. female.  Patient with 2 complaints.  1 has been low back pain that radiates into the left leg started 3 weeks ago.  Being followed by her primary care doctor for this.  They have given her exercises symptomatic treatment and they want follow-up after after several weeks of the exercises and then they will consider referral to physical therapy.  Patient also 18 days ago when walking through the house barefoot thinks she got a foreign body stuck in the base of her left third toe.  Has been hurting since.  Mostly when she puts pressure on it.  Patient has no pain with movement of her left lower extremity no weakness or numbness to the top or bottom of the foot.  She can move her ankle well toes well knee well and flex and extend her hip without any pain.  Patient seems to think that the back pain kind of comes from the low back more so on the left and radiates into the right leg suggestive of a sciatica picture.  No incontinence.       Home Medications Prior to Admission medications   Medication Sig Start Date End Date Taking? Authorizing Provider  montelukast (SINGULAIR) 10 MG tablet Take 10 mg by mouth daily. 11/18/22  Yes [provider]  benzonatate  (TESSALON ) 100 MG capsule Take 1 capsule (100 mg total) by mouth every 8 (eight) hours. Patient not taking: Reported on 12/02/2022 05/27/22   Small, Brooke L, PA  cyclobenzaprine  (FLEXERIL ) 10 MG tablet Take 1 tablet (10 mg total) by mouth at bedtime. Patient not taking: Reported on 12/02/2022 05/15/21   Mortenson, Ashley, MD  guaiFENesin -codeine  100-10 MG/5ML syrup Take 10 mLs by mouth every 6 (six) hours as needed for cough. Patient not taking: Reported on  12/02/2022 05/25/22   Geroldine Berg, MD  levothyroxine (SYNTHROID) 50 MCG tablet Take 1 tablet by mouth daily. 04/30/19   [provider]  naproxen  (NAPROSYN ) 500 MG tablet Take 1 tablet (500 mg total) by mouth 2 (two) times daily. Patient not taking: Reported on 12/02/2022 05/15/21   Mortenson, Ashley, MD  neomycin -polymyxin-hydrocortisone  (CORTISPORIN) 3.5-10000-1 OTIC suspension Place 4 drops into the right ear 3 (three) times daily. Patient not taking: Reported on 12/02/2022 06/24/21   Teresa Shelba SAUNDERS, NP  ondansetron  (ZOFRAN -ODT) 4 MG disintegrating tablet 4mg  ODT q4 hours prn nausea/vomit Patient not taking: Reported on 12/02/2022 08/07/22   Emil Share, DO  predniSONE  (STERAPRED UNI-PAK 21 TAB) 10 MG (21) TBPK tablet As directed 06/28/21   Raspet, Erin K, PA-C  promethazine -dextromethorphan (PROMETHAZINE -DM) 6.25-15 MG/5ML syrup Take 5 mLs by mouth at bedtime as needed for cough. Patient not taking: Reported on 12/02/2022 06/24/21   Teresa Shelba SAUNDERS, NP  iron  polysaccharides (NIFEREX) 150 MG capsule Take 1 capsule (150 mg total) by mouth daily. Patient not taking: Reported on 01/25/2018 05/01/17 02/21/19  Sung Hollering, CNM      Allergies    Patient has no known allergies.    Review of Systems   Review of Systems  Constitutional:  Negative for chills and fever.  HENT:  Negative for ear pain and sore throat.   Eyes:  Negative  for pain and visual disturbance.  Respiratory:  Negative for cough and shortness of breath.   Cardiovascular:  Negative for chest pain and palpitations.  Gastrointestinal:  Negative for abdominal pain and vomiting.  Genitourinary:  Negative for dysuria and hematuria.  Musculoskeletal:  Positive for back pain. Negative for arthralgias.  Skin:  Negative for color change and rash.  Neurological:  Negative for seizures, syncope, weakness and numbness.  All other systems reviewed and are negative.   Physical Exam Updated Vital Signs BP 115/78   Pulse 79   Temp  98.7 F (37.1 C) (Oral)   Resp 16   LMP 12/27/2022 (Approximate)   SpO2 100%  Physical Exam Vitals and nursing note reviewed.  Constitutional:      General: She is not in acute distress.    Appearance: Normal appearance. She is well-developed. She is not ill-appearing.  HENT:     Head: Normocephalic and atraumatic.     Mouth/Throat:     Mouth: Mucous membranes are moist.  Eyes:     Extraocular Movements: Extraocular movements intact.     Conjunctiva/sclera: Conjunctivae normal.     Pupils: Pupils are equal, round, and reactive to light.  Cardiovascular:     Rate and Rhythm: Normal rate and regular rhythm.     Heart sounds: No murmur heard. Pulmonary:     Effort: Pulmonary effort is normal. No respiratory distress.     Breath sounds: Normal breath sounds.  Abdominal:     Palpations: Abdomen is soft.     Tenderness: There is no abdominal tenderness.  Musculoskeletal:        General: Tenderness and signs of injury present. No swelling or deformity.     Cervical back: Neck supple.     Right lower leg: No edema.     Left lower leg: No edema.     Comments: No tenderness to palpation to lumbar spine.  Some tenderness to palpation to the plantar surface of the third toe of the left foot.  Flow no evidence of a wound but may be a little bit of scar.  Also some tenderness with range of motion.  Dorsalis pedis pulses 2+ good cap refill sensation intact dorsally and on the plantar surface.  Good range of motion at the ankle knee and hip.  Skin:    General: Skin is warm and dry.     Capillary Refill: Capillary refill takes less than 2 seconds.  Neurological:     General: No focal deficit present.     Mental Status: She is alert and oriented to person, place, and time.     Cranial Nerves: No cranial nerve deficit.     Sensory: No sensory deficit.     Motor: No weakness.  Psychiatric:        Mood and Affect: Mood normal.     ED Results / Procedures / Treatments   Labs (all labs  ordered are listed, but only abnormal results are displayed) Labs Reviewed - No data to display  EKG None  Radiology No results found.  Procedures Procedures    Medications Ordered in ED Medications - No data to display  ED Course/ Medical Decision Making/ A&P                                 Medical Decision Making Amount and/or Complexity of Data Reviewed Radiology: ordered.   Patient being followed up by primary care doctor  for the low back pain.  Here tonight we will evaluate the concern for foreign body in the left third toe base.  Will get x-ray.  X-ray not formally read.  But to my review it appears that there is a sliver of something radiopaque at that toe.  There was no open wound.  Do not feel appropriate to go digging.  Will refer patient to podiatry to see if they want to try to remove it or whether they will let it get spit out naturally.   Final Clinical Impression(s) / ED Diagnoses Final diagnoses:  Acute left-sided low back pain with left-sided sciatica  Foot pain, left    Rx / DC Orders ED Discharge Orders     None         Geraldene Hamilton, MD 01/26/23 MEDIA    Geraldene Hamilton, MD 01/26/23 2138

## 2023-02-05 ENCOUNTER — Encounter: Payer: Self-pay | Admitting: Podiatry

## 2023-02-05 ENCOUNTER — Ambulatory Visit: Payer: Medicaid Other | Admitting: Podiatry

## 2023-02-05 DIAGNOSIS — Z01818 Encounter for other preprocedural examination: Secondary | ICD-10-CM

## 2023-02-05 DIAGNOSIS — S90852A Superficial foreign body, left foot, initial encounter: Secondary | ICD-10-CM | POA: Diagnosis not present

## 2023-02-05 NOTE — Progress Notes (Signed)
 Subjective:  Patient ID: Casey Obrien, female    DOB: [birth year],  MRN: [id number]  Chief Complaint  Patient presents with   Foot Pain    Patient states a month ago she stamp on something hard and some blood came out , and now after that when she walks and steps down she feels pain . Patient takes medication for pain . Two months ago she had pain in her right feet and she got a xray done and they said there is no problem but she still has pain when she walks , which is on top of her right foot . Slipped on her right foot .    33 y.o. female presents with the above complaint.  Patient presents with left third digit foreign body in the distal tip of the toe.  She states that when she walks and says that she feels the pain.  She has to take pain medication.  Happened about 2 months ago.  She got x-ray and she states there is a foreign body.  She has not seen and was prior to seeing me her pain is mostly in the toes.  Pain scale 7 out of 10 dull aching nature.   Review of Systems: Negative except as noted in the HPI. Denies N/V/F/Ch.  Past Medical History:  Diagnosis Date   Medical history non-contributory    Thyroid  disease     Current Outpatient Medications:    benzonatate  (TESSALON ) 100 MG capsule, Take 1 capsule (100 mg total) by mouth every 8 (eight) hours., Disp: 21 capsule, Rfl: 0   cyclobenzaprine  (FLEXERIL ) 10 MG tablet, Take 1 tablet (10 mg total) by mouth at bedtime., Disp: 20 tablet, Rfl: 0   guaiFENesin -codeine  100-10 MG/5ML syrup, Take 10 mLs by mouth every 6 (six) hours as needed for cough., Disp: 120 mL, Rfl: 0   levothyroxine (SYNTHROID) 50 MCG tablet, Take 1 tablet by mouth daily., Disp: , Rfl:    montelukast (SINGULAIR) 10 MG tablet, Take 10 mg by mouth daily., Disp: , Rfl:    naproxen  (NAPROSYN ) 500 MG tablet, Take 1 tablet (500 mg total) by mouth 2 (two) times daily., Disp: 20 tablet, Rfl: 0   neomycin -polymyxin-hydrocortisone  (CORTISPORIN) 3.5-10000-1 OTIC  suspension, Place 4 drops into the right ear 3 (three) times daily., Disp: 10 mL, Rfl: 0   ondansetron  (ZOFRAN -ODT) 4 MG disintegrating tablet, 4mg  ODT q4 hours prn nausea/vomit, Disp: 20 tablet, Rfl: 0   predniSONE  (STERAPRED UNI-PAK 21 TAB) 10 MG (21) TBPK tablet, As directed, Disp: 21 tablet, Rfl: 0   promethazine -dextromethorphan (PROMETHAZINE -DM) 6.25-15 MG/5ML syrup, Take 5 mLs by mouth at bedtime as needed for cough., Disp: 100 mL, Rfl: 0  Social History   Tobacco Use  Smoking Status Never  Smokeless Tobacco Never    No Known Allergies Objective:  There were no vitals filed for this visit. There is no height or weight on file to calculate BMI. Constitutional Well developed. Well nourished.  Vascular Dorsalis pedis pulses palpable bilaterally. Posterior tibial pulses palpable bilaterally. Capillary refill normal to all digits.  No cyanosis or clubbing noted. Pedal hair growth normal.  Neurologic Normal speech. Oriented to person, place, and time. Epicritic sensation to light touch grossly present bilaterally.  Dermatologic N left third digit foreign body nodule point noted.  No clinical signs of infection noted pain on palpation to the distal tip  Orthopedic: Normal joint ROM without pain or crepitus bilaterally. No visible deformities. No bony tenderness.   Radiographs: None Assessment:  1. Foreign body in left foot, initial encounter    Plan:  Patient was evaluated and treated and all questions answered.  Left third digit foreign body -All questions and concerns were discussed with the patient extensive detail x-rays were reviewed which shows foreign body to the distal tip of the third digit.  Appears to be deeper without an entry point therefore I believe patient would benefit from surgical excision.  I discussed this with patient she states understanding would like to proceed with surgery Informed surgical risk consent was reviewed and read aloud to the patient.  I  reviewed the films.  I have discussed my findings with the patient in great detail.  I have discussed all risks including but not limited to infection, stiffness, scarring, limp, disability, deformity, damage to blood vessels and nerves, numbness, poor healing, need for braces, arthritis, chronic pain, amputation, death.  All benefits and realistic expectations discussed in great detail.  I have made no promises as to the outcome.  I have provided realistic expectations.  I have offered the patient a 2nd opinion, which they have declined and assured me they preferred to proceed despite the risks   No follow-ups on file.

## 2023-02-10 ENCOUNTER — Telehealth: Payer: Self-pay | Admitting: Podiatry

## 2023-02-10 NOTE — Telephone Encounter (Signed)
 DOS-02/22/23  EXC FOREIGN BODY LT- [id number]  [facility] EFFECTIVE DATE- 10/26/20  SPOKE WITH NICOLE M. FROM [facility] AND SHE STATED THAT PRIOR AUTH IS NOT REQUIRED FOR CPT CODE 60454.  CALL REFERENCE NUMBER: I - [id number]

## 2023-02-22 ENCOUNTER — Other Ambulatory Visit: Payer: Self-pay | Admitting: Podiatry

## 2023-02-22 DIAGNOSIS — M795 Residual foreign body in soft tissue: Secondary | ICD-10-CM | POA: Diagnosis not present

## 2023-02-22 MED ORDER — IBUPROFEN 800 MG PO TABS: 800.0000 mg | ORAL_TABLET | Freq: Four times a day (QID) | ORAL | 1 refills | Status: DC | PRN

## 2023-02-22 MED ORDER — OXYCODONE-ACETAMINOPHEN 5-325 MG PO TABS: 1.0000 | ORAL_TABLET | ORAL | 0 refills | Status: DC | PRN

## 2023-02-24 ENCOUNTER — Encounter: Payer: Self-pay | Admitting: Podiatry

## 2023-03-03 ENCOUNTER — Encounter: Payer: Self-pay | Admitting: Podiatry

## 2023-03-03 ENCOUNTER — Ambulatory Visit (INDEPENDENT_AMBULATORY_CARE_PROVIDER_SITE_OTHER): Payer: Medicaid Other | Admitting: Podiatry

## 2023-03-03 ENCOUNTER — Ambulatory Visit (INDEPENDENT_AMBULATORY_CARE_PROVIDER_SITE_OTHER): Payer: Medicaid Other

## 2023-03-03 DIAGNOSIS — Z9889 Other specified postprocedural states: Secondary | ICD-10-CM

## 2023-03-03 DIAGNOSIS — S90852A Superficial foreign body, left foot, initial encounter: Secondary | ICD-10-CM

## 2023-03-03 NOTE — Progress Notes (Signed)
  Subjective:  Patient ID: Casey Obrien, female    DOB: 1990-11-29,  MRN: [id number]  Chief Complaint  Patient presents with   Routine Post Op    RM#12 POV # 1 DOS 02/22/23 --- LEFT 3ED DIGIT FOREIGN BODY REMOVAL- Patient states she is unable to walk feeling lots of pain. Taking tylenol  for pain as needed.    DOS: 02/22/23 Procedure: Left third digit foreign body removal  33 y.o. female returns for post-op check.  Patient clinically doing well.  Incisions intact.  Minimal pain.  Denies any other acute complaints  Review of Systems: Negative except as noted in the HPI. Denies N/V/F/Ch.  Past Medical History:  Diagnosis Date   Medical history non-contributory    Thyroid  disease     Current Outpatient Medications:    benzonatate  (TESSALON ) 100 MG capsule, Take 1 capsule (100 mg total) by mouth every 8 (eight) hours., Disp: 21 capsule, Rfl: 0   cyclobenzaprine  (FLEXERIL ) 10 MG tablet, Take 1 tablet (10 mg total) by mouth at bedtime., Disp: 20 tablet, Rfl: 0   guaiFENesin -codeine  100-10 MG/5ML syrup, Take 10 mLs by mouth every 6 (six) hours as needed for cough., Disp: 120 mL, Rfl: 0   ibuprofen  (ADVIL ) 800 MG tablet, Take 1 tablet (800 mg total) by mouth every 6 (six) hours as needed., Disp: 60 tablet, Rfl: 1   levothyroxine (SYNTHROID) 50 MCG tablet, Take 1 tablet by mouth daily., Disp: , Rfl:    montelukast (SINGULAIR) 10 MG tablet, Take 10 mg by mouth daily., Disp: , Rfl:    naproxen  (NAPROSYN ) 500 MG tablet, Take 1 tablet (500 mg total) by mouth 2 (two) times daily., Disp: 20 tablet, Rfl: 0   neomycin -polymyxin-hydrocortisone  (CORTISPORIN) 3.5-10000-1 OTIC suspension, Place 4 drops into the right ear 3 (three) times daily., Disp: 10 mL, Rfl: 0   ondansetron  (ZOFRAN -ODT) 4 MG disintegrating tablet, 4mg  ODT q4 hours prn nausea/vomit, Disp: 20 tablet, Rfl: 0   oxyCODONE -acetaminophen  (PERCOCET) 5-325 MG tablet, Take 1 tablet by mouth every 4 (four) hours as needed for severe pain (pain  score 7-10)., Disp: 30 tablet, Rfl: 0   predniSONE  (STERAPRED UNI-PAK 21 TAB) 10 MG (21) TBPK tablet, As directed, Disp: 21 tablet, Rfl: 0   promethazine -dextromethorphan (PROMETHAZINE -DM) 6.25-15 MG/5ML syrup, Take 5 mLs by mouth at bedtime as needed for cough., Disp: 100 mL, Rfl: 0  Social History   Tobacco Use  Smoking Status Never  Smokeless Tobacco Never    No Known Allergies Objective:  There were no vitals filed for this visit. There is no height or weight on file to calculate BMI. Constitutional Well developed. Well nourished.  Vascular Foot warm and well perfused. Capillary refill normal to all digits.   Neurologic Normal speech. Oriented to person, place, and time. Epicritic sensation to light touch grossly present bilaterally.  Dermatologic Skin healing well without signs of infection. Skin edges well coapted without signs of infection.  Orthopedic: Tenderness to palpation noted about the surgical site.   Radiographs: Previous films were drawn left foot: No foreign body noted.  No abnormalities noted. Assessment:   1. Post-operative state    Plan:  Patient was evaluated and treated and all questions answered.  S/p foot surgery left -Progressing as expected post-operatively. -XR: See above -WB Status: Weightbearing as tolerated in surgical shoe -Sutures: Intact.  No clinical signs of dehiscence noted. -Medications: None -Foot redressed.  No follow-ups on file.

## 2023-03-17 ENCOUNTER — Encounter: Payer: Medicaid Other | Admitting: Podiatry

## 2023-03-19 ENCOUNTER — Ambulatory Visit (INDEPENDENT_AMBULATORY_CARE_PROVIDER_SITE_OTHER): Payer: Medicaid Other | Admitting: Podiatry

## 2023-03-19 DIAGNOSIS — Z9889 Other specified postprocedural states: Secondary | ICD-10-CM

## 2023-03-19 NOTE — Progress Notes (Signed)
  Subjective:  Patient ID: Casey Obrien, female    DOB: 1990/07/14,  MRN: [id number]  Chief Complaint  Patient presents with   Routine Post Op    POV # 2 DOS 02/22/23 --- LEFT 3ED DIGIT FOREIGN BODY REMOVAL    DOS: 02/22/23 Procedure: Left third digit foreign body removal  33 y.o. female returns for post-op check.  Patient clinically doing well.  Incisions intact.  Minimal pain.  Denies any other acute complaints  Review of Systems: Negative except as noted in the HPI. Denies N/V/F/Ch.  Past Medical History:  Diagnosis Date   Medical history non-contributory    Thyroid disease     Current Outpatient Medications:    benzonatate (TESSALON) 100 MG capsule, Take 1 capsule (100 mg total) by mouth every 8 (eight) hours., Disp: 21 capsule, Rfl: 0   cyclobenzaprine (FLEXERIL) 10 MG tablet, Take 1 tablet (10 mg total) by mouth at bedtime., Disp: 20 tablet, Rfl: 0   guaiFENesin-codeine 100-10 MG/5ML syrup, Take 10 mLs by mouth every 6 (six) hours as needed for cough., Disp: 120 mL, Rfl: 0   ibuprofen (ADVIL) 800 MG tablet, Take 1 tablet (800 mg total) by mouth every 6 (six) hours as needed., Disp: 60 tablet, Rfl: 1   levothyroxine (SYNTHROID) 50 MCG tablet, Take 1 tablet by mouth daily., Disp: , Rfl:    montelukast (SINGULAIR) 10 MG tablet, Take 10 mg by mouth daily., Disp: , Rfl:    naproxen (NAPROSYN) 500 MG tablet, Take 1 tablet (500 mg total) by mouth 2 (two) times daily., Disp: 20 tablet, Rfl: 0   neomycin-polymyxin-hydrocortisone (CORTISPORIN) 3.5-10000-1 OTIC suspension, Place 4 drops into the right ear 3 (three) times daily., Disp: 10 mL, Rfl: 0   ondansetron (ZOFRAN-ODT) 4 MG disintegrating tablet, 4mg  ODT q4 hours prn nausea/vomit, Disp: 20 tablet, Rfl: 0   oxyCODONE-acetaminophen (PERCOCET) 5-325 MG tablet, Take 1 tablet by mouth every 4 (four) hours as needed for severe pain (pain score 7-10)., Disp: 30 tablet, Rfl: 0   predniSONE (STERAPRED UNI-PAK 21 TAB) 10 MG (21) TBPK  tablet, As directed, Disp: 21 tablet, Rfl: 0   promethazine-dextromethorphan (PROMETHAZINE-DM) 6.25-15 MG/5ML syrup, Take 5 mLs by mouth at bedtime as needed for cough., Disp: 100 mL, Rfl: 0  Social History   Tobacco Use  Smoking Status Never  Smokeless Tobacco Never    No Known Allergies Objective:  There were no vitals filed for this visit. There is no height or weight on file to calculate BMI. Constitutional Well developed. Well nourished.  Vascular Foot warm and well perfused. Capillary refill normal to all digits.   Neurologic Normal speech. Oriented to person, place, and time. Epicritic sensation to light touch grossly present bilaterally.  Dermatologic Skin completely epithelialized.  No signs of Deis and no no complication noted no signs of foreign body noted  Orthopedic: No tenderness to palpation noted about the surgical site.   Radiographs: Previous films were drawn left foot: No foreign body noted.  No abnormalities noted. Assessment:   No diagnosis found.  Plan:  Patient was evaluated and treated and all questions answered.  S/p foot surgery left -Progressing as expected post-operatively. -XR: See above -WB Status: Weightbearing as tolerated in regular shoes -Sutures: Removed no clinical signs of dehiscence noted. -Medications: None -Patient is officially discharged from care if any foot and ankle issues on future she will come back and see me  No follow-ups on file.

## 2023-04-30 ENCOUNTER — Ambulatory Visit: Admitting: Podiatry

## 2023-04-30 ENCOUNTER — Encounter: Payer: Self-pay | Admitting: Podiatry

## 2023-04-30 DIAGNOSIS — M7752 Other enthesopathy of left foot: Secondary | ICD-10-CM

## 2023-04-30 NOTE — Progress Notes (Signed)
 Subjective:  Patient ID: Casey Obrien, female    DOB: February 22, 1990,  MRN: [id number]  Chief Complaint  Patient presents with   Foot Pain    Medial foot left - patient states since surgery she hasn't been able to put full weight on foot and surgical area still numb -  DOS 02/22/23 --- LEFT 3ED DIGIT FOREIGN BODY REMOVAL    33 y.o. female presents with the above complaint.  Patient presents with left first metatarsophalangeal joint capsulitis hurts with ambulation with pressure she would like to discuss treatment options for this pain scale 7 out of 10 dull aching nature she would like to do injection.  We had spoken about this briefly but she is not ready to go undergo injection   Review of Systems: Negative except as noted in the HPI. Denies N/V/F/Ch.  Past Medical History:  Diagnosis Date   Medical history non-contributory    Thyroid disease     Current Outpatient Medications:    benzonatate (TESSALON) 100 MG capsule, Take 1 capsule (100 mg total) by mouth every 8 (eight) hours., Disp: 21 capsule, Rfl: 0   cyclobenzaprine (FLEXERIL) 10 MG tablet, Take 1 tablet (10 mg total) by mouth at bedtime., Disp: 20 tablet, Rfl: 0   guaiFENesin-codeine 100-10 MG/5ML syrup, Take 10 mLs by mouth every 6 (six) hours as needed for cough., Disp: 120 mL, Rfl: 0   ibuprofen (ADVIL) 800 MG tablet, Take 1 tablet (800 mg total) by mouth every 6 (six) hours as needed., Disp: 60 tablet, Rfl: 1   levothyroxine (SYNTHROID) 50 MCG tablet, Take 1 tablet by mouth daily., Disp: , Rfl:    montelukast (SINGULAIR) 10 MG tablet, Take 10 mg by mouth daily., Disp: , Rfl:    naproxen (NAPROSYN) 500 MG tablet, Take 1 tablet (500 mg total) by mouth 2 (two) times daily., Disp: 20 tablet, Rfl: 0   neomycin-polymyxin-hydrocortisone (CORTISPORIN) 3.5-10000-1 OTIC suspension, Place 4 drops into the right ear 3 (three) times daily., Disp: 10 mL, Rfl: 0   ondansetron (ZOFRAN-ODT) 4 MG disintegrating tablet, 4mg  ODT q4 hours prn  nausea/vomit, Disp: 20 tablet, Rfl: 0   oxyCODONE-acetaminophen (PERCOCET) 5-325 MG tablet, Take 1 tablet by mouth every 4 (four) hours as needed for severe pain (pain score 7-10)., Disp: 30 tablet, Rfl: 0   predniSONE (STERAPRED UNI-PAK 21 TAB) 10 MG (21) TBPK tablet, As directed, Disp: 21 tablet, Rfl: 0   promethazine-dextromethorphan (PROMETHAZINE-DM) 6.25-15 MG/5ML syrup, Take 5 mLs by mouth at bedtime as needed for cough., Disp: 100 mL, Rfl: 0  Social History   Tobacco Use  Smoking Status Never  Smokeless Tobacco Never    No Known Allergies Objective:  There were no vitals filed for this visit. There is no height or weight on file to calculate BMI. Constitutional Well developed. Well nourished.  Vascular Dorsalis pedis pulses palpable bilaterally. Posterior tibial pulses palpable bilaterally. Capillary refill normal to all digits.  No cyanosis or clubbing noted. Pedal hair growth normal.  Neurologic Normal speech. Oriented to person, place, and time. Epicritic sensation to light touch grossly present bilaterally.  Dermatologic Nails well groomed and normal in appearance. No open wounds. No skin lesions.  Orthopedic: Pain on palpation left first metatarsophalangeal joint pain with range of motion of the joint no deep intra-articular pain noted no intra-articular Or crepitus noted.   Radiographs: None Assessment:  No diagnosis found. Plan:  Patient was evaluated and treated and all questions answered.  Left first MTP capsulitis - All questions and  concerns were discussed with the patient in extensive detail given the amount of pain that she is having she would benefit from a steroid injection of decrease inflammatory component surgical pain.  Patient agrees with plan like to proceed with steroid injection -A steroid injection was performed at left first MTP using 1% plain Lidocaine and 10 mg of Kenalog. This was well tolerated.   No follow-ups on file.

## 2023-08-16 ENCOUNTER — Encounter: Payer: Self-pay | Admitting: Obstetrics and Gynecology

## 2023-08-16 ENCOUNTER — Ambulatory Visit: Admitting: Obstetrics and Gynecology

## 2023-08-16 ENCOUNTER — Other Ambulatory Visit (HOSPITAL_COMMUNITY)
Admission: RE | Admit: 2023-08-16 | Discharge: 2023-08-16 | Disposition: A | Discharge status: Home / Self Care | Source: Ambulatory Visit | Attending: Obstetrics and Gynecology | Admitting: Obstetrics and Gynecology

## 2023-08-16 VITALS — BP 110/75 | HR 88 | Wt 164.0 lb

## 2023-08-16 DIAGNOSIS — R300 Dysuria: Secondary | ICD-10-CM | POA: Diagnosis present

## 2023-08-16 DIAGNOSIS — Z30432 Encounter for removal of intrauterine contraceptive device: Secondary | ICD-10-CM | POA: Diagnosis not present

## 2023-08-16 MED ORDER — NESTABS 32-1 MG PO TABS: 1.0000 | ORAL_TABLET | Freq: Every day | ORAL | 11 refills | Status: AC

## 2023-08-16 NOTE — Progress Notes (Signed)
 33 yo P2 with LMP 08/01/23 and BMI 30 who is here for IUD removal as patient desires to conceive. Patient reports a monthly period lasting 5 days. Patient reports vaginal irritation and dysuria for the past few days. She is without any other concerns. Patient is being followed for thyroid  disease  Past Medical History:  Diagnosis Date   Medical history non-contributory    Thyroid  disease    Past Surgical History:  Procedure Laterality Date   APPENDECTOMY     CESAREAN SECTION     cervix not dilating    CESAREAN SECTION N/A 04/29/2017   Procedure: REPEAT CESAREAN SECTION;  Surgeon: Corene Coy, MD;  Location: [facility] BIRTHING SUIT[facility];  Service: Obstetrics;  Laterality: N/A;   CESAREAN SECTION     TONSILLECTOMY     Family History  Problem Relation Age of Onset   Hypertension Mother    Diabetes Father    Hypertension Father    Heart disease Father    Cancer Neg Hx    Stroke Neg Hx    Social History   Tobacco Use   Smoking status: Never   Smokeless tobacco: Never  Vaping Use   Vaping status: Never Used  Substance Use Topics   Alcohol use: Never   Drug use: Never   ROS See pertinent in HPI. All other systems reviewed and non contributory Blood pressure 110/75, pulse 88, weight 164 lb (74.4 kg), last menstrual period 08/01/2023. GENERAL: Well-developed, well-nourished female in no acute distress.  ABDOMEN: Soft, nontender, nondistended. No organomegaly. PELVIC: Normal external female genitalia. Vagina is pink and rugated.  Normal discharge. Normal appearing cervix with IUD strings at the os. Chaperone present during the pelvic exam EXTREMITIES: No cyanosis, clubbing, or edema, 2+ distal pulses.  A/P 33 yo here for IUD removal - Encouraged patient to start prenatal vitamins - Vaginal swab collected - Patient will be contacted with abnormal results - Encouraged patient to follow up with endocrinologist for dose adjustment when pregnancy  GYNECOLOGY CLINIC PROCEDURE  NOTE  Casey Obrien is a 33 y.o. [id number] here for IUD removal.   IUD Removal  Patient identified, informed consent performed, consent signed.  Patient was in the dorsal lithotomy position, normal external genitalia was noted.  A speculum was placed in the patient's vagina, normal discharge was noted, no lesions. The cervix was visualized, no lesions, no abnormal discharge.  The strings of the IUD were grasped and pulled using ring forceps. The IUD was removed in its entirety.  Patient tolerated the procedure well.    Patient plans for pregnancy soon and she was told to avoid teratogens, take PNV and folic acid.  Routine preventative health maintenance measures emphasized.

## 2023-08-16 NOTE — Progress Notes (Signed)
 Pt is in office for IUD removal, pt would like a pregnancy. Pt would like to discuss ovulation due to thyroid  disease.  Pt states she has been having regular cycles with IUD use.

## 2023-08-17 LAB — CERVICOVAGINAL ANCILLARY ONLY
Bacterial Vaginitis (gardnerella): POSITIVE — AB
Candida Glabrata: NEGATIVE
Candida Vaginitis: NEGATIVE
Chlamydia: NEGATIVE
Comment: NEGATIVE
Comment: NEGATIVE
Comment: NEGATIVE
Comment: NEGATIVE
Comment: NEGATIVE
Comment: NORMAL
Neisseria Gonorrhea: NEGATIVE
Trichomonas: NEGATIVE

## 2023-08-18 ENCOUNTER — Ambulatory Visit: Payer: Self-pay | Admitting: Obstetrics and Gynecology

## 2023-08-18 DIAGNOSIS — E079 Disorder of thyroid, unspecified: Secondary | ICD-10-CM | POA: Insufficient documentation

## 2023-08-18 LAB — URINE CULTURE

## 2023-08-18 MED ORDER — METRONIDAZOLE 500 MG PO TABS: 500.0000 mg | ORAL_TABLET | Freq: Two times a day (BID) | ORAL | 0 refills | Status: DC

## 2023-10-26 ENCOUNTER — Ambulatory Visit

## 2023-10-26 VITALS — BP 118/71 | HR 101

## 2023-10-26 DIAGNOSIS — Z3201 Encounter for pregnancy test, result positive: Secondary | ICD-10-CM

## 2023-10-26 LAB — POCT URINE PREGNANCY: Preg Test, Ur: POSITIVE — AB

## 2023-10-26 NOTE — Progress Notes (Signed)
 [facility] here for a UPT. Pt had a positive upt at home. LMP is 09/25/23.     UPT in office Positive.    Reviewed medications and informed to start a PNV, if not already. Pt to follow up in 4 weeks for New OB visit.

## 2023-11-05 ENCOUNTER — Emergency Department (HOSPITAL_BASED_OUTPATIENT_CLINIC_OR_DEPARTMENT_OTHER)

## 2023-11-05 ENCOUNTER — Encounter (HOSPITAL_BASED_OUTPATIENT_CLINIC_OR_DEPARTMENT_OTHER): Payer: Self-pay

## 2023-11-05 ENCOUNTER — Other Ambulatory Visit: Payer: Self-pay

## 2023-11-05 ENCOUNTER — Emergency Department (HOSPITAL_BASED_OUTPATIENT_CLINIC_OR_DEPARTMENT_OTHER)
Admission: EM | Admit: 2023-11-05 | Discharge: 2023-11-05 | Disposition: A | Discharge status: Home / Self Care | Attending: Emergency Medicine | Admitting: Emergency Medicine

## 2023-11-05 DIAGNOSIS — R1013 Epigastric pain: Secondary | ICD-10-CM | POA: Diagnosis present

## 2023-11-05 DIAGNOSIS — R112 Nausea with vomiting, unspecified: Secondary | ICD-10-CM | POA: Diagnosis not present

## 2023-11-05 DIAGNOSIS — R1011 Right upper quadrant pain: Secondary | ICD-10-CM | POA: Diagnosis not present

## 2023-11-05 DIAGNOSIS — R1012 Left upper quadrant pain: Secondary | ICD-10-CM | POA: Diagnosis not present

## 2023-11-05 LAB — CBC
HCT: 38.3 % (ref 36.0–46.0)
Hemoglobin: 12.9 g/dL (ref 12.0–15.0)
MCH: 27 pg (ref 26.0–34.0)
MCHC: 33.7 g/dL (ref 30.0–36.0)
MCV: 80.1 fL (ref 80.0–100.0)
Platelets: 193 K/uL (ref 150–400)
RBC: 4.78 MIL/uL (ref 3.87–5.11)
RDW: 13.6 % (ref 11.5–15.5)
WBC: 9 K/uL (ref 4.0–10.5)
nRBC: 0 % (ref 0.0–0.2)

## 2023-11-05 LAB — COMPREHENSIVE METABOLIC PANEL WITH GFR
ALT: 14 U/L (ref 0–44)
AST: 11 U/L — ABNORMAL LOW (ref 15–41)
Albumin: 4.5 g/dL (ref 3.5–5.0)
Alkaline Phosphatase: 46 U/L (ref 38–126)
Anion gap: 13 (ref 5–15)
BUN: 10 mg/dL (ref 6–20)
CO2: 22 mmol/L (ref 22–32)
Calcium: 9.6 mg/dL (ref 8.9–10.3)
Chloride: 103 mmol/L (ref 98–111)
Creatinine, Ser: 0.58 mg/dL (ref 0.44–1.00)
GFR, Estimated: >60 mL/min (ref >60)
Glucose, Bld: 97 mg/dL (ref 70–99)
Potassium: 3.8 mmol/L (ref 3.5–5.1)
Sodium: 138 mmol/L (ref 135–145)
Total Bilirubin: 0.9 mg/dL (ref 0.0–1.2)
Total Protein: 7.3 g/dL (ref 6.5–8.1)

## 2023-11-05 LAB — URINALYSIS, ROUTINE W REFLEX MICROSCOPIC
Bilirubin Urine: NEGATIVE
Glucose, UA: NEGATIVE mg/dL
Ketones, ur: 40 mg/dL — AB
Nitrite: NEGATIVE
Protein, ur: NEGATIVE mg/dL
Specific Gravity, Urine: 1.028 (ref 1.005–1.030)
pH: 5.5 (ref 5.0–8.0)

## 2023-11-05 LAB — LIPASE, BLOOD: Lipase: 21 U/L (ref 11–51)

## 2023-11-05 LAB — PREGNANCY, URINE: Preg Test, Ur: POSITIVE — AB

## 2023-11-05 LAB — HCG, QUANTITATIVE, PREGNANCY: hCG, Beta Chain, Quant, S: 20794 m[IU]/mL — ABNORMAL HIGH (ref <5)

## 2023-11-05 MED ORDER — ONDANSETRON 4 MG PO TBDP
4.0000 mg | ORAL_TABLET | Freq: Once | ORAL | Status: AC
  Administered 2023-11-05: 4 mg via ORAL
  Filled 2023-11-05: qty 1

## 2023-11-05 MED ORDER — ALUM & MAG HYDROXIDE-SIMETH 200-200-20 MG/5ML PO SUSP
30.0000 mL | Freq: Once | ORAL | Status: AC
  Administered 2023-11-05: 30 mL via ORAL
  Filled 2023-11-05: qty 30

## 2023-11-05 MED ORDER — ONDANSETRON 4 MG PO TBDP: 4.0000 mg | ORAL_TABLET | Freq: Three times a day (TID) | ORAL | 0 refills | Status: AC | PRN

## 2023-11-05 NOTE — ED Notes (Signed)
 Lab to run hCG quant using previously collected sample

## 2023-11-05 NOTE — Discharge Instructions (Signed)
??? ????? ??????? ????? ???????? ??????? ?? ????? ??????? ?????? ????? ????? ???????? ??? ??????? ?????? ??? ????? ?????.  ?????? ??????? ???????? ??????? ???? ???? ????? ????? ??? ????? ?? ??????? ??? ??????? ??????? ??????? ????? ???????.  ?????? ?????? ??? ??????? ??? ?????? ??? ????? ????? ?? ?? ???.   kama dhakarna sabqan, yurja alailtizam bmwedk mae tabibat altawlid li'iieadat taqyim alhaml bialmawjat fawq alsawtiat wa'iieadat fahs alhaml bialdum. ymknk astikhdam milanta, almutawafir bidun wasfat tibiyatin, lieilaj 'alam albatn fi  almintaqat fawq almaeidati, wazufiran almusuf lieilaj alghathayani. ymknk murajaeat qism altawari eind alhajat li'ayi 'aerad muqliqat fi 'ayi waqta.almintaqat fawq almaeidati, wazufiran almusuf lieilaj alghathayani. ymknk murajaeat qism altawari eind alhajat li'ayi 'aerad muqliqat fi 'ayi waqta.  As we discussed, please keep the appointment with your OB for re-evaluation of the pregnancy by ultrasound and repeat blood pregnancy test.   You can use Mylanta, found over-the-counter, for epigastric abdominal pain, and Zofran  as prescribed for nausea.   Return to the ED as needed for any concerning symptoms at any time.

## 2023-11-05 NOTE — ED Provider Notes (Signed)
 [place] EMERGENCY DEPARTMENT AT [facility] Provider Note   CSN: [id number] Arrival date & time: 11/05/23  1422     Patient presents with: Abdominal Pain, Emesis, and Dizziness   Casey Obrien is a 33 y.o. female.   Patient to ED for evaluation of epigastric abdominal pain, nausea, and vomiting that started yesterday. No fever, hematemesis, diarrhea. She reports being one month pregnant but denies lower abdominal pain, vaginal bleeding or discharge. She has her first appointment with OB pending for US .   The history is provided by the patient. A language interpreter was used.  Abdominal Pain Associated symptoms: vomiting   Emesis Associated symptoms: abdominal pain   Dizziness Associated symptoms: vomiting        Prior to Admission medications   Medication Sig Start Date End Date Taking? Authorizing Provider  benzonatate  (TESSALON ) 100 MG capsule Take 1 capsule (100 mg total) by mouth every 8 (eight) hours. 05/27/22   Small, Brooke L, PA  cyclobenzaprine  (FLEXERIL ) 10 MG tablet Take 1 tablet (10 mg total) by mouth at bedtime. 05/15/21   Van Knee, MD  guaiFENesin -codeine  100-10 MG/5ML syrup Take 10 mLs by mouth every 6 (six) hours as needed for cough. 05/25/22   Geroldine Berg, MD  ibuprofen  (ADVIL ) 800 MG tablet Take 1 tablet (800 mg total) by mouth every 6 (six) hours as needed. 02/22/23   Tobie Franky SQUIBB, DPM  levothyroxine (SYNTHROID) 50 MCG tablet Take 1 tablet by mouth daily. 04/30/19   [provider]  metroNIDAZOLE  (FLAGYL ) 500 MG tablet Take 1 tablet (500 mg total) by mouth 2 (two) times daily. 08/18/23   Constant, Peggy, MD  montelukast (SINGULAIR) 10 MG tablet Take 10 mg by mouth daily. 11/18/22   [provider]  naproxen  (NAPROSYN ) 500 MG tablet Take 1 tablet (500 mg total) by mouth 2 (two) times daily. 05/15/21   Mortenson, Ashley, MD  neomycin -polymyxin-hydrocortisone  (CORTISPORIN) 3.5-10000-1 OTIC suspension Place 4 drops into the  right ear 3 (three) times daily. 06/24/21   Teresa Shelba SAUNDERS, NP  ondansetron  (ZOFRAN -ODT) 4 MG disintegrating tablet 4mg  ODT q4 hours prn nausea/vomit 08/07/22   Floyd, Dan, DO  oxyCODONE -acetaminophen  (PERCOCET) 5-325 MG tablet Take 1 tablet by mouth every 4 (four) hours as needed for severe pain (pain score 7-10). 02/22/23   Tobie Franky SQUIBB, DPM  predniSONE  (STERAPRED UNI-PAK 21 TAB) 10 MG (21) TBPK tablet As directed 06/28/21   Raspet, Erin K, PA-C  Prenat-Fe Bisgly-FA-w/o Vit A (NESTABS ) 32-1 MG TABS Take 1 tablet by mouth daily. 08/16/23   Constant, Peggy, MD  promethazine -dextromethorphan (PROMETHAZINE -DM) 6.25-15 MG/5ML syrup Take 5 mLs by mouth at bedtime as needed for cough. 06/24/21   White, Shelba SAUNDERS, NP  iron  polysaccharides (NIFEREX) 150 MG capsule Take 1 capsule (150 mg total) by mouth daily. Patient not taking: Reported on 01/25/2018 05/01/17 02/21/19  Sung Hollering, CNM    Allergies: Patient has no known allergies.    Review of Systems  Gastrointestinal:  Positive for abdominal pain and vomiting.  Neurological:  Positive for dizziness.    Updated Vital Signs BP 130/80   Pulse 98   Temp 97.7 F (36.5 C)   Resp 16   LMP 09/25/2023   SpO2 98%   Physical Exam Vitals and nursing note reviewed.  Constitutional:      General: She is not in acute distress.    Appearance: She is well-developed. She is not ill-appearing.  Cardiovascular:     Rate and Rhythm: Normal rate.  Pulmonary:  Effort: Pulmonary effort is normal.  Abdominal:     Palpations: Abdomen is soft.     Tenderness: There is abdominal tenderness (Epigastric tenderness. RUQ and LUQ nontender. No lower abdominal tenderness.).  Musculoskeletal:        General: Normal range of motion.     Cervical back: Normal range of motion.  Skin:    General: Skin is warm and dry.  Neurological:     Mental Status: She is alert and oriented to person, place, and time.     (all labs ordered are listed, but only abnormal  results are displayed) Labs Reviewed  COMPREHENSIVE METABOLIC PANEL WITH GFR - Abnormal; Notable for the following components:      Result Value   AST 11 (*)    All other components within normal limits  URINALYSIS, ROUTINE W REFLEX MICROSCOPIC - Abnormal; Notable for the following components:   Hgb urine dipstick TRACE (*)    Ketones, ur 40 (*)    Leukocytes,Ua SMALL (*)    Bacteria, UA RARE (*)    All other components within normal limits  PREGNANCY, URINE - Abnormal; Notable for the following components:   Preg Test, Ur POSITIVE (*)    All other components within normal limits  HCG, QUANTITATIVE, PREGNANCY - Abnormal; Notable for the following components:   hCG, Beta Chain, Quant, S 20,794 (*)    All other components within normal limits  LIPASE, BLOOD  CBC    EKG: None  Radiology: US  OB LESS THAN 14 WEEKS WITH OB TRANSVAGINAL Result Date: 11/05/2023 CLINICAL DATA:  Epigastric pain and vomiting x1 day. EXAM: OBSTETRIC <14 WK US  AND TRANSVAGINAL OB US  TECHNIQUE: Both transabdominal and transvaginal ultrasound examinations were performed for complete evaluation of the gestation as well as the maternal uterus, adnexal regions, and pelvic cul-de-sac. Transvaginal technique was performed to assess early pregnancy. COMPARISON:  None Available. FINDINGS: Intrauterine gestational sac: Single (limited in visualization secondary to overlying artifact) Yolk sac:  Visualized. Embryo:  Not Visualized. Cardiac Activity: Not Visualized. Heart Rate: N/A  bpm MSD: 16.7 mm   6 w   4 d Subchorionic hemorrhage:  Small (limited in visualization) Maternal uterus/adnexae: The right and left ovary are not visualized. A trace amount of pelvic free fluid is noted. IMPRESSION: Probable early intrauterine gestational sac and yolk sac, but no fetal pole or cardiac activity yet visualized. Recommend follow-up quantitative B-HCG levels and follow-up US  in 14 days to assess viability. This recommendation follows SRU  consensus guidelines: Diagnostic Criteria for Nonviable Pregnancy Early in the First Trimester. LOISE Diedra PARAS Med 2013; 630:8556-48. Electronically Signed   By: Suzen Dials M.D.   On: 11/05/2023 18:28     Procedures   Medications Ordered in the ED  ondansetron  (ZOFRAN -ODT) disintegrating tablet 4 mg (4 mg Oral Given 11/05/23 1717)  alum & mag hydroxide-simeth (MAALOX/MYLANTA) 200-200-20 MG/5ML suspension 30 mL (30 mLs Oral Given 11/05/23 1716)    Clinical Course as of 11/05/23 1855  Fri Nov 05, 2023  1647 Patient to ED with epigastric abdominal pain that started yesterday. She reports she is early in second pregnancy without any pregnancy concerns today.  [SU]  1848 US  performed to assess pregnancy and, per radiology:   IMPRESSION: Probable early intrauterine gestational sac and yolk sac, but no fetal pole or cardiac activity yet visualized. Recommend follow-up quantitative B-HCG levels and follow-up US  in 14 days to assess viability. This recommendation follows SRU consensus guidelines: Diagnostic Criteria for Nonviable Pregnancy Early in the First  Trimester. LOISE Diedra PARAS Med 2013; 630:8556-48.  An interpreter was used to discuss these results and the recommendation for follow up. She has an already scheduled appointment with OB for 10/28.  Epigastric pain is almost completely resolved. No further nausea. She is tolerating PO's currently. Will provide Zofran , recommend mylanta for use at home.  [SU]    Clinical Course User Index [SU] Odell Balls, PA-C                                 Medical Decision Making Amount and/or Complexity of Data Reviewed Labs: ordered. Radiology: ordered.  Risk OTC drugs. Prescription drug management.        Final diagnoses:  Epigastric pain  Nausea and vomiting, unspecified vomiting type    ED Discharge Orders     None          Odell Balls, PA-C 11/05/23 DANIAL Doretha Folks, MD 11/08/23 2324

## 2023-11-05 NOTE — ED Triage Notes (Signed)
 Patient reports she is 1 month pregnant. Last night and today she has had increased abdominal pain and three episodes of vomiting. She is unable to keep anything down. She denies vaginal bleeding or discharge.

## 2023-11-05 NOTE — ED Notes (Signed)
 Reviewed AVS/discharge instruction with patient. Time allotted for and all questions answered. Patient is agreeable for d/c and escorted to ed exit by staff.

## 2023-11-10 IMAGING — DX DG CHEST 2V
2 series · 2 of 2 positions shown · non-contrast
Comparison: None Available.

CLINICAL DATA: worsening cough

EXAM:
CHEST - 2 VIEW

[chest pa]
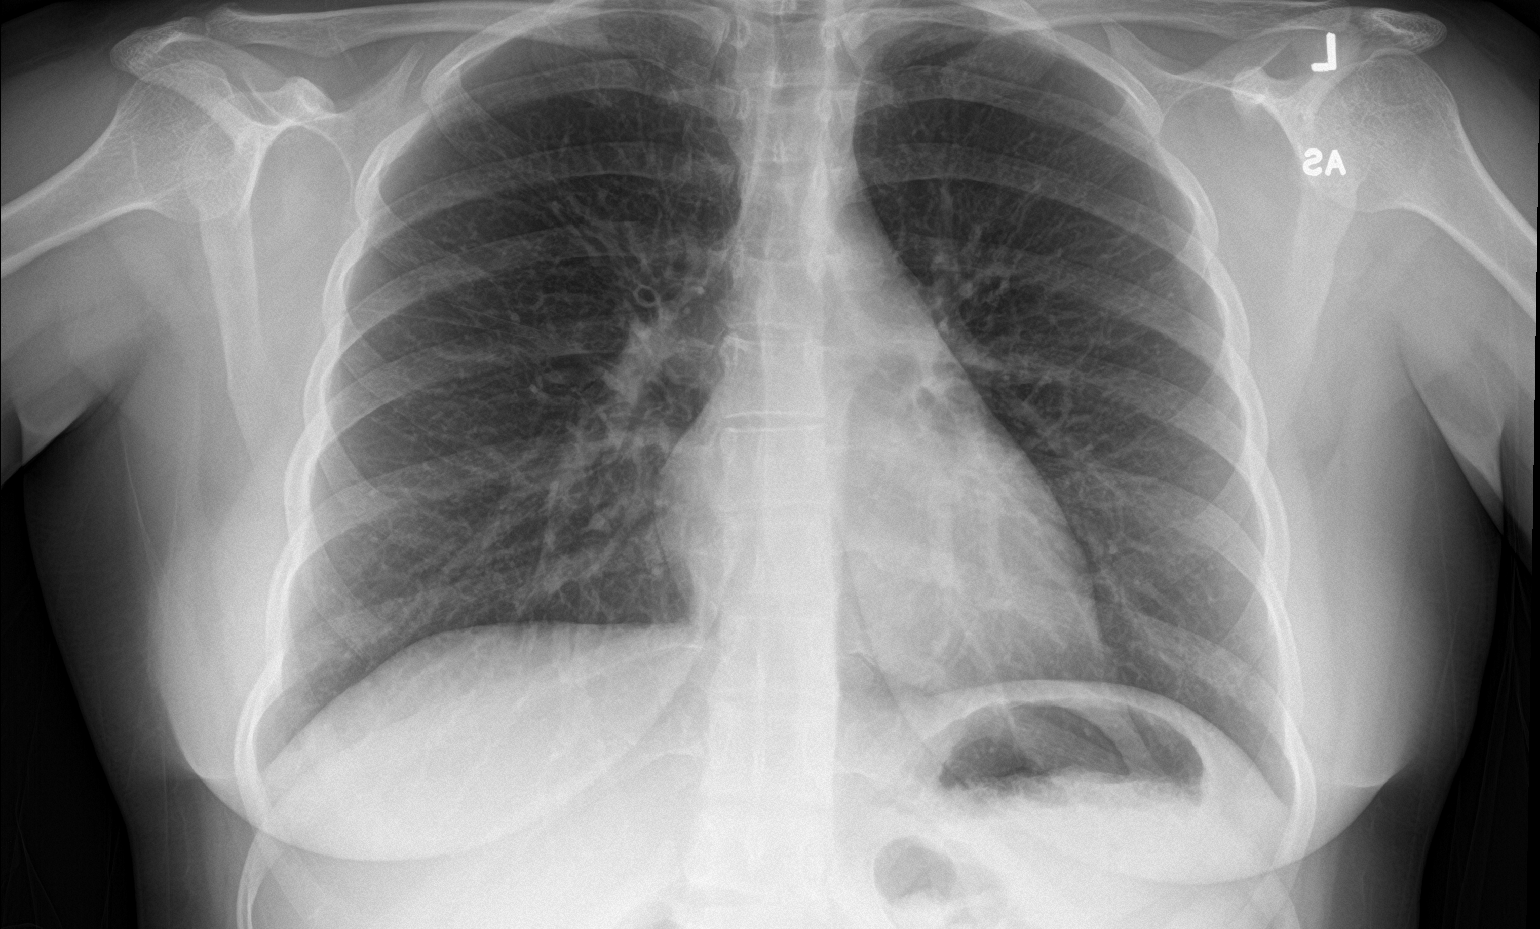

[chest lat]
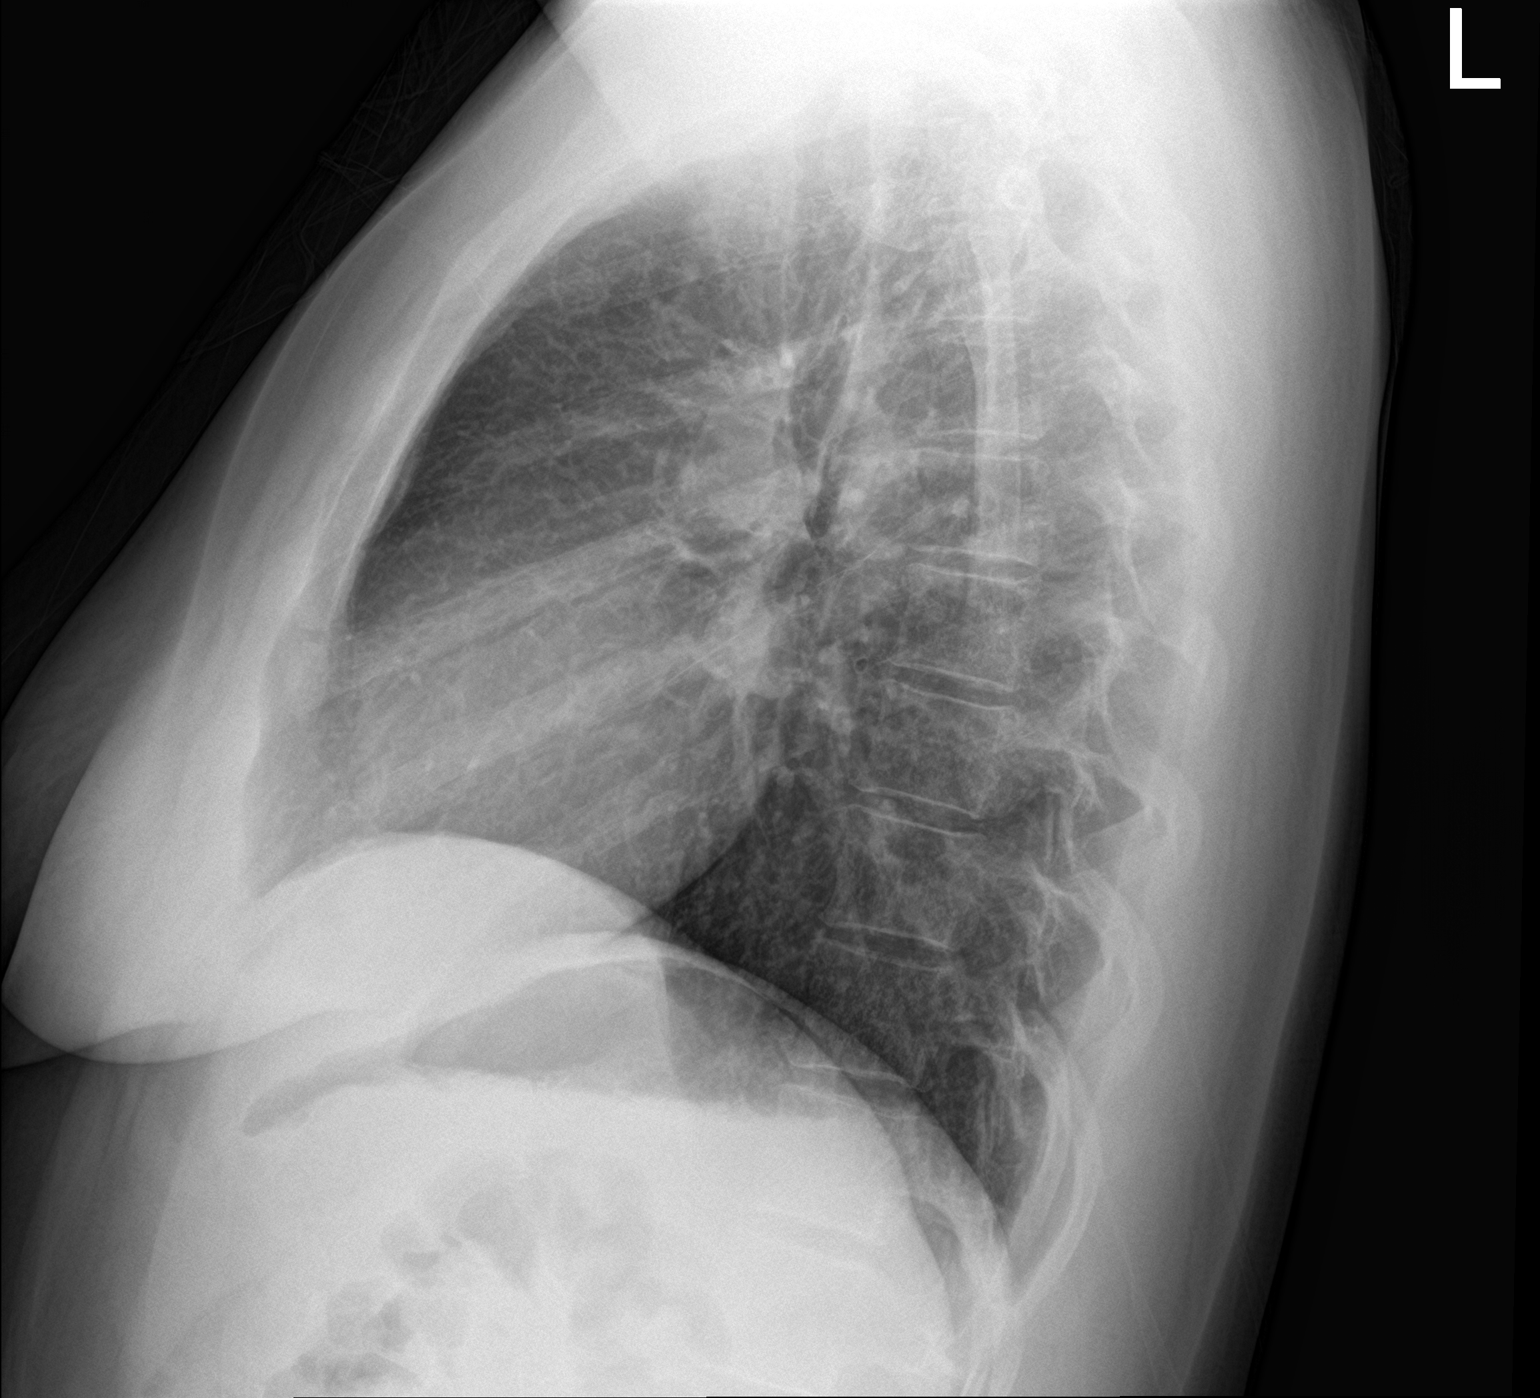

[2 of 2 positions shown; findings below may reference images not displayed]

FINDINGS: The heart and mediastinal contours are within normal limits.

Collimation of bilateral lung apices. No focal consolidation. No
pulmonary edema. No pleural effusion. No pneumothorax.

No acute osseous abnormality.
IMPRESSION: No active cardiopulmonary disease.

## 2023-11-23 ENCOUNTER — Other Ambulatory Visit (HOSPITAL_COMMUNITY)
Admission: RE | Admit: 2023-11-23 | Discharge: 2023-11-23 | Disposition: A | Discharge status: Home / Self Care | Source: Ambulatory Visit | Attending: Obstetrics and Gynecology | Admitting: Obstetrics and Gynecology

## 2023-11-23 ENCOUNTER — Other Ambulatory Visit (INDEPENDENT_AMBULATORY_CARE_PROVIDER_SITE_OTHER): Payer: Self-pay

## 2023-11-23 ENCOUNTER — Ambulatory Visit: Admitting: *Deleted

## 2023-11-23 VITALS — BP 112/72 | HR 86 | Wt 166.3 lb

## 2023-11-23 DIAGNOSIS — Z3A08 8 weeks gestation of pregnancy: Secondary | ICD-10-CM

## 2023-11-23 DIAGNOSIS — O0990 Supervision of high risk pregnancy, unspecified, unspecified trimester: Secondary | ICD-10-CM | POA: Diagnosis present

## 2023-11-23 DIAGNOSIS — O3680X0 Pregnancy with inconclusive fetal viability, not applicable or unspecified: Secondary | ICD-10-CM

## 2023-11-23 MED ORDER — PROMETHAZINE HCL 25 MG PO TABS: 25.0000 mg | ORAL_TABLET | Freq: Four times a day (QID) | ORAL | 1 refills | Status: AC | PRN

## 2023-11-23 NOTE — Patient Instructions (Signed)
[facility] has a partnership with the [employer] to provide prenatal navigation for the most needed resources in our community. In order to see how we can help connect you to these resources we need consent to contact you. Please complete the very short consent using the link below:   English Link: [email or web address]  Spanish Link: [email or web address]  

## 2023-11-23 NOTE — Progress Notes (Signed)
 New OB Intake  I connected with Casey Obrien  on 11/23/23 at 10:15 AM EDT by In Person Visit and verified that I am speaking with the correct person using two identifiers. Nurse is located at [facility] and pt is located at [place].  I discussed the limitations, risks, security and privacy concerns of performing an evaluation and management service by telephone and the availability of in person appointments. I also discussed with the patient that there may be a patient responsible charge related to this service. The patient expressed understanding and agreed to proceed.  I explained I am completing New OB Intake today. We discussed EDD of 07/01/24 based on LMP of 09/25/23. Pt is G3P2002. I reviewed her allergies, medications and Medical/Surgical/OB history.    Patient Active Problem List   Diagnosis Date Noted   Disorder of thyroid  gland 08/18/2023   Atypical squamous cells of undetermined significance (ASCUS) on Papanicolaou smear of cervix 12/07/2022   Mild persistent asthma without complication 10/15/2022   Mild intermittent asthma 10/08/2022   Encounter for insertion of copper IUD 06/15/2018   Abnormal thyroid  blood test 03/28/2018   Acute blood loss anemia 05/01/2017   Status post repeat low transverse cesarean section 04/29/2017   Constipation 04/08/2017   Carpal tunnel syndrome during pregnancy 03/23/2017   Language barrier 02/23/2017   History of C-section 12/03/2016     Concerns addressed today  Delivery Plans Plans to deliver at [facility]. Discussed the nature of our practice with multiple providers including residents and students as well as female and female providers. Due to the size of the practice, the delivering provider may not be the same as those providing prenatal care.   MyChart/Babyscripts MyChart access verified. I explained pt will have some visits in office and some virtually. Babyscripts instructions given and order placed. Patient verifies receipt of  registration text/e-mail. Account successfully created and app downloaded. If patient is a candidate for Optimized scheduling, add to sticky note.   Blood Pressure Cuff/Weight Scale Blood pressure cuff ordered for patient to pick-up from [employer]. Explained after first prenatal appt pt will check weekly and document in Babyscripts. Patient does not have weight scale; patient may purchase if they desire to track weight weekly in Babyscripts.  Anatomy US  Explained first scheduled US  will be around 19 weeks. Anatomy US  scheduled for TBD at TBD.  Is patient a candidate for Babyscripts Optimization? Risk Factors/ Designer, Industrial/product.   First visit review I reviewed new OB appt with patient. Explained pt will be seen by Dr. Alger at first visit. Discussed [place] genetic screening with patient. Requests Panorama and Horizon.. Routine prenatal labs OB Urine, GC/CC collected at today's visit.   Last Pap Diagnosis  Date Value Ref Range Status  12/02/2022 (A)  Final   - Atypical squamous cells of undetermined significance (ASC-US )    Casey CHRISTELLA Ober, RN 11/23/2023  11:20 AM

## 2023-11-24 ENCOUNTER — Ambulatory Visit: Payer: Self-pay | Admitting: Obstetrics and Gynecology

## 2023-11-24 LAB — CERVICOVAGINAL ANCILLARY ONLY
Chlamydia: NEGATIVE
Comment: NEGATIVE
Comment: NORMAL
Neisseria Gonorrhea: NEGATIVE

## 2023-11-25 LAB — CULTURE, OB URINE

## 2023-11-25 LAB — URINE CULTURE, OB REFLEX

## 2023-11-30 ENCOUNTER — Other Ambulatory Visit: Payer: Self-pay | Admitting: *Deleted

## 2023-11-30 DIAGNOSIS — O219 Vomiting of pregnancy, unspecified: Secondary | ICD-10-CM

## 2023-11-30 MED ORDER — FAMOTIDINE 20 MG PO TABS: 20.0000 mg | ORAL_TABLET | Freq: Two times a day (BID) | ORAL | 3 refills | Status: AC

## 2023-11-30 MED ORDER — DOXYLAMINE-PYRIDOXINE 10-10 MG PO TBEC: 2.0000 | DELAYED_RELEASE_TABLET | Freq: Every day | ORAL | 5 refills | Status: AC

## 2023-11-30 NOTE — Progress Notes (Signed)
 Pt and husband came in to office to request RX Zofran . Husband is interpreting for pt. Advised of black box warning for Zofran  regarding increased risk for cleft lip and pallet. Pt  reports persistent N/V and little relief from phenergan . Pt is able to keep down some food and fluids. RX Diclegis and Pepcid  sent with approval from Dr. Cleatus.

## 2023-12-20 ENCOUNTER — Encounter: Payer: Self-pay | Admitting: Obstetrics and Gynecology

## 2023-12-20 ENCOUNTER — Ambulatory Visit: Admitting: Obstetrics and Gynecology

## 2023-12-20 VITALS — BP 112/75 | HR 101 | Wt 164.0 lb

## 2023-12-20 DIAGNOSIS — Z3A12 12 weeks gestation of pregnancy: Secondary | ICD-10-CM

## 2023-12-20 DIAGNOSIS — Z131 Encounter for screening for diabetes mellitus: Secondary | ICD-10-CM

## 2023-12-20 DIAGNOSIS — Z98891 History of uterine scar from previous surgery: Secondary | ICD-10-CM

## 2023-12-20 DIAGNOSIS — O0990 Supervision of high risk pregnancy, unspecified, unspecified trimester: Secondary | ICD-10-CM

## 2023-12-20 NOTE — Progress Notes (Signed)
  Subjective:    Casey Obrien is a [id number] [redacted]w[redacted]d being seen today for her first obstetrical visit.  Her obstetrical history is significant for previous cesarean section x 2 (first for NRFHT and the second was an elective repeat) and hypothyroidism on synthroid. Patient does intend to breast feed. Pregnancy history fully reviewed.  Patient reports no complaints.  Vitals:   12/20/23 1055  BP: 112/75  Pulse: (!) 101  Weight: 164 lb (74.4 kg)    HISTORY: OB History  Gravida Para Term Preterm AB Living  3 2 2  0 0 2  SAB IAB Ectopic Multiple Live Births  0 0 0  2    # Outcome Date GA Lbr Len/2nd Weight Sex Type Anes PTL Lv  3 Current           2 Term 04/29/17 [redacted]w[redacted]d  7 lb 11.3 oz (3.495 kg) M CS-LTranv Spinal  LIV  1 Term 02/29/16 [redacted]w[redacted]d 08:00 / 01:00 8 lb 1.8 oz (3.68 kg) M CS-LTranv Spinal N LIV     Complications: Failure to Progress in Second Stage, Fetal Intolerance, Intraamniotic Infection   Past Medical History:  Diagnosis Date   Asthma    Hypothyroidism    Thyroid  disease    Past Surgical History:  Procedure Laterality Date   APPENDECTOMY     CESAREAN SECTION     cervix not dilating    CESAREAN SECTION N/A 04/29/2017   Procedure: REPEAT CESAREAN SECTION;  Surgeon: Corene Coy, MD;  Location: [facility];  Service: Obstetrics;  Laterality: N/A;   TONSILLECTOMY     Family History  Problem Relation Age of Onset   Hypertension Mother    Diabetes Father    Hypertension Father    Heart disease Father    Cancer Neg Hx    Stroke Neg Hx      Exam    System: Breast:  normal appearance, no masses or tenderness   Skin: normal coloration and turgor, no rashes    Neurologic: no focal deficits   Extremities: normal strength, tone, and muscle mass   HEENT extra ocular movement intact   Mouth/Teeth mucous membranes moist, pharynx normal without lesions and dental hygiene good   Neck supple and no masses   Cardiovascular: regular rate and rhythm    Respiratory:  appears well, vitals normal, no respiratory distress, acyanotic, normal RR, chest clear, no wheezing, crepitations, rhonchi, normal symmetric air entry   Abdomen: soft, non-tender; bowel sounds normal; no masses,  no organomegaly   Urinary:       Assessment:    Pregnancy: [id number] Patient Active Problem List   Diagnosis Date Noted   Supervision of high risk pregnancy, antepartum 11/23/2023   Disorder of thyroid  gland 08/18/2023   Atypical squamous cells of undetermined significance (ASCUS) on Papanicolaou smear of cervix 12/07/2022   Mild persistent asthma without complication 10/15/2022   Mild intermittent asthma 10/08/2022   Abnormal thyroid  blood test 03/28/2018   Acute blood loss anemia 05/01/2017   Constipation 04/08/2017   Carpal tunnel syndrome during pregnancy 03/23/2017   Language barrier 02/23/2017   History of C-section 12/03/2016        Plan:     Initial labs drawn. Prenatal vitamins. Problem list reviewed and updated. Genetic Screening discussed : ordered.  Ultrasound discussed; fetal survey: ordered.  Follow up in 4 weeks. 50% of 30 min visit spent on counseling and coordination of care.     Randi Poullard 12/20/2023

## 2023-12-21 LAB — THYROID PANEL WITH TSH
Free Thyroxine Index: 2.1 (ref 1.2–4.9)
T3 Uptake Ratio: 18 % — ABNORMAL LOW (ref 24–39)
T4, Total: 11.6 ug/dL (ref 4.5–12.0)
TSH: 1.7 u[IU]/mL (ref 0.450–4.500)

## 2023-12-21 LAB — CBC/D/PLT+RPR+RH+ABO+RUBIGG...
Antibody Screen: NEGATIVE
Basophils Absolute: 0 x10E3/uL (ref 0.0–0.2)
Basos: 0 %
EOS (ABSOLUTE): 0.1 x10E3/uL (ref 0.0–0.4)
Eos: 1 %
HCV Ab: NONREACTIVE
HIV Screen 4th Generation wRfx: NONREACTIVE
Hematocrit: 35.9 % (ref 34.0–46.6)
Hemoglobin: 11.6 g/dL (ref 11.1–15.9)
Hepatitis B Surface Ag: NEGATIVE
Immature Grans (Abs): 0 x10E3/uL (ref 0.0–0.1)
Immature Granulocytes: 0 %
Lymphocytes Absolute: 2 x10E3/uL (ref 0.7–3.1)
Lymphs: 29 %
MCH: 27.1 pg (ref 26.6–33.0)
MCHC: 32.3 g/dL (ref 31.5–35.7)
MCV: 84 fL (ref 79–97)
Monocytes Absolute: 0.4 x10E3/uL (ref 0.1–0.9)
Monocytes: 6 %
Neutrophils Absolute: 4.5 x10E3/uL (ref 1.4–7.0)
Neutrophils: 64 %
Platelets: 186 x10E3/uL (ref 150–450)
RBC: 4.28 x10E6/uL (ref 3.77–5.28)
RDW: 13.7 % (ref 11.7–15.4)
RPR Ser Ql: NONREACTIVE
Rh Factor: POSITIVE
Rubella Antibodies, IGG: 4.03 {index} (ref >0.99)
WBC: 7.1 x10E3/uL (ref 3.4–10.8)

## 2023-12-21 LAB — HCV INTERPRETATION

## 2023-12-21 LAB — HEMOGLOBIN A1C
Est. average glucose Bld gHb Est-mCnc: 103 mg/dL
Hgb A1c MFr Bld: 5.2 % (ref 4.8–5.6)

## 2023-12-25 LAB — PANORAMA PRENATAL TEST FULL PANEL:PANORAMA TEST PLUS 5 ADDITIONAL MICRODELETIONS: FETAL FRACTION: 8.8

## 2023-12-28 LAB — HORIZON CUSTOM: REPORT SUMMARY: NEGATIVE

## 2024-01-17 ENCOUNTER — Ambulatory Visit (INDEPENDENT_AMBULATORY_CARE_PROVIDER_SITE_OTHER): Admitting: Obstetrics and Gynecology

## 2024-01-17 ENCOUNTER — Encounter: Payer: Self-pay | Admitting: Obstetrics and Gynecology

## 2024-01-17 VITALS — BP 119/72 | HR 92 | Wt 165.6 lb

## 2024-01-17 DIAGNOSIS — O0992 Supervision of high risk pregnancy, unspecified, second trimester: Secondary | ICD-10-CM

## 2024-01-17 DIAGNOSIS — Z758 Other problems related to medical facilities and other health care: Secondary | ICD-10-CM | POA: Diagnosis not present

## 2024-01-17 DIAGNOSIS — R0981 Nasal congestion: Secondary | ICD-10-CM

## 2024-01-17 DIAGNOSIS — Z3A16 16 weeks gestation of pregnancy: Secondary | ICD-10-CM

## 2024-01-17 DIAGNOSIS — Z98891 History of uterine scar from previous surgery: Secondary | ICD-10-CM | POA: Diagnosis not present

## 2024-01-17 DIAGNOSIS — O0990 Supervision of high risk pregnancy, unspecified, unspecified trimester: Secondary | ICD-10-CM

## 2024-01-17 DIAGNOSIS — E039 Hypothyroidism, unspecified: Secondary | ICD-10-CM | POA: Diagnosis not present

## 2024-01-17 DIAGNOSIS — Z603 Acculturation difficulty: Secondary | ICD-10-CM

## 2024-01-17 MED ORDER — FLUTICASONE PROPIONATE 50 MCG/ACT NA SUSP: NASAL | 0 refills | Status: DC

## 2024-01-17 NOTE — Progress Notes (Signed)
 Pt presents for ROB visit. No concerns

## 2024-01-17 NOTE — Progress Notes (Signed)
" ° °  PRENATAL VISIT NOTE  Subjective:  Casey Obrien is a 33 y.o. G3P2002 at [redacted]w[redacted]d being seen today for ongoing prenatal care.  She is currently monitored for the following issues for this high-risk pregnancy and has History of C-section; Language barrier; Carpal tunnel syndrome during pregnancy; Constipation; Acute blood loss anemia; Abnormal thyroid  blood test; Atypical squamous cells of undetermined significance (ASCUS) on Papanicolaou smear of cervix; Disorder of thyroid  gland; Mild intermittent asthma; Mild persistent asthma without complication; and Supervision of high risk pregnancy, antepartum on their problem list.  Patient reports reports congestion, sinus pressure and intermittent headache x 1-2 wks.  Contractions: Not present. Vag. Bleeding: None.  Movement: Present. Denies leaking of fluid.   The following portions of the patient's history were reviewed and updated as appropriate: allergies, current medications, past family history, past medical history, past social history, past surgical history and problem list.   Objective:   Vitals:   01/17/24 1018  BP: 119/72  Pulse: 92  Weight: 165 lb 9.6 oz (75.1 kg)    Fetal Status:  Fetal Heart Rate (bpm): 146   Movement: Present    General: Alert, oriented and cooperative. Patient is in no acute distress.  Skin: Skin is warm and dry. No rash noted.   Cardiovascular: Normal heart rate noted  Respiratory: Normal respiratory effort, no problems with respiration noted  Abdomen: Soft, gravid, appropriate for gestational age.  Pain/Pressure: Absent     Pelvic: Cervical exam deferred        Extremities: Normal range of motion.  Edema: None  Mental Status: Normal mood and affect. Normal behavior. Normal judgment and thought content.   Assessment and Plan:  Pregnancy: G3P2002 at [redacted]w[redacted]d 1. Supervision of high risk pregnancy, antepartum (Primary) BP and FHR normal Doing well, feeling regular movement    2. History of C-section X  2  3. Language barrier In person interpreter   4. Hypothyroidism, unspecified type Has appt 12/29  On synthroid   5. Nasal congestion Trial flonase  and encourage tylenol  PRN, if not better, discussed being seen  - fluticasone  (FLONASE ) 50 MCG/ACT nasal spray; Spray one spray into each nostril once in the morning and once at night  Dispense: 9.9 mL; Refill: 0  morning and once at night  Dispense: 9.9 mL; Refill: 0  6. [redacted] weeks gestation of pregnancy AFP today Anatomy scan 1/12 - AFP, Serum, Open Spina Bifida   Preterm labor symptoms and general obstetric precautions including but not limited to vaginal bleeding, contractions, leaking of fluid and fetal movement were reviewed in detail with the patient. Please refer to After Visit Summary for other counseling recommendations.   Return in about 4 weeks (around 02/14/2024) for OB VISIT (MD or APP).  Future Appointments  Date Time Provider Department Center  02/07/2024 10:00 AM [facility] PROVIDER 1 [facility]  02/07/2024 10:30 AM [facility] US3 [facility]  02/14/2024 11:15 AM Delores Nidia CROME, FNP [facility] None    Nidia Delores, FNP  "

## 2024-01-20 ENCOUNTER — Ambulatory Visit: Payer: Self-pay | Admitting: Obstetrics and Gynecology

## 2024-01-20 DIAGNOSIS — O0990 Supervision of high risk pregnancy, unspecified, unspecified trimester: Secondary | ICD-10-CM

## 2024-01-20 LAB — AFP, SERUM, OPEN SPINA BIFIDA
AFP MoM: 1.62
AFP Value: 50 ng/mL
Gest. Age on Collection Date: 16 wk
Maternal Age At EDD: 34.4 a
OSBR Risk 1 IN: 1995
Test Results:: NEGATIVE
Weight: 165 [lb_av]

## 2024-01-21 ENCOUNTER — Other Ambulatory Visit: Payer: Self-pay

## 2024-01-21 ENCOUNTER — Encounter (HOSPITAL_BASED_OUTPATIENT_CLINIC_OR_DEPARTMENT_OTHER): Payer: Self-pay

## 2024-01-21 ENCOUNTER — Emergency Department (HOSPITAL_BASED_OUTPATIENT_CLINIC_OR_DEPARTMENT_OTHER)
Admission: EM | Admit: 2024-01-21 | Discharge: 2024-01-21 | Disposition: A | Discharge status: Home / Self Care | Attending: Emergency Medicine | Admitting: Emergency Medicine

## 2024-01-21 DIAGNOSIS — R0981 Nasal congestion: Secondary | ICD-10-CM | POA: Diagnosis not present

## 2024-01-21 DIAGNOSIS — Z3A16 16 weeks gestation of pregnancy: Secondary | ICD-10-CM | POA: Diagnosis not present

## 2024-01-21 DIAGNOSIS — R059 Cough, unspecified: Secondary | ICD-10-CM | POA: Insufficient documentation

## 2024-01-21 DIAGNOSIS — J3489 Other specified disorders of nose and nasal sinuses: Secondary | ICD-10-CM | POA: Diagnosis not present

## 2024-01-21 DIAGNOSIS — O219 Vomiting of pregnancy, unspecified: Secondary | ICD-10-CM | POA: Insufficient documentation

## 2024-01-21 LAB — BASIC METABOLIC PANEL WITH GFR
Anion gap: 13 (ref 5–15)
BUN: 10 mg/dL (ref 6–20)
CO2: 23 mmol/L (ref 22–32)
Calcium: 9.2 mg/dL (ref 8.9–10.3)
Chloride: 103 mmol/L (ref 98–111)
Creatinine, Ser: 0.45 mg/dL (ref 0.44–1.00)
GFR, Estimated: >60 mL/min
Glucose, Bld: 85 mg/dL (ref 70–99)
Potassium: 3.4 mmol/L — ABNORMAL LOW (ref 3.5–5.1)
Sodium: 139 mmol/L (ref 135–145)

## 2024-01-21 LAB — RESP PANEL BY RT-PCR (RSV, FLU A&B, COVID)  RVPGX2
Influenza A by PCR: NEGATIVE
Influenza B by PCR: NEGATIVE
Resp Syncytial Virus by PCR: NEGATIVE
SARS Coronavirus 2 by RT PCR: NEGATIVE

## 2024-01-21 LAB — CBC WITH DIFFERENTIAL/PLATELET
Abs Immature Granulocytes: 0.07 K/uL (ref 0.00–0.07)
Basophils Absolute: 0 K/uL (ref 0.0–0.1)
Basophils Relative: 0 %
Eosinophils Absolute: 0.1 K/uL (ref 0.0–0.5)
Eosinophils Relative: 1 %
HCT: 30.7 % — ABNORMAL LOW (ref 36.0–46.0)
Hemoglobin: 10.5 g/dL — ABNORMAL LOW (ref 12.0–15.0)
Immature Granulocytes: 1 %
Lymphocytes Relative: 18 %
Lymphs Abs: 1.4 K/uL (ref 0.7–4.0)
MCH: 28 pg (ref 26.0–34.0)
MCHC: 34.2 g/dL (ref 30.0–36.0)
MCV: 81.9 fL (ref 80.0–100.0)
Monocytes Absolute: 0.5 K/uL (ref 0.1–1.0)
Monocytes Relative: 6 %
Neutro Abs: 6 K/uL (ref 1.7–7.7)
Neutrophils Relative %: 74 %
Platelets: 151 K/uL (ref 150–400)
RBC: 3.75 MIL/uL — ABNORMAL LOW (ref 3.87–5.11)
RDW: 14.9 % (ref 11.5–15.5)
WBC: 8.1 K/uL (ref 4.0–10.5)
nRBC: 0 % (ref 0.0–0.2)

## 2024-01-21 LAB — HCG, QUANTITATIVE, PREGNANCY: hCG, Beta Chain, Quant, S: 25498 m[IU]/mL — ABNORMAL HIGH

## 2024-01-21 MED ORDER — ONDANSETRON HCL 4 MG/2ML IJ SOLN
4.0000 mg | Freq: Once | INTRAMUSCULAR | Status: AC
  Administered 2024-01-21: 4 mg via INTRAVENOUS
  Filled 2024-01-21: qty 2

## 2024-01-21 MED ORDER — ONDANSETRON HCL 4 MG PO TABS: 4.0000 mg | ORAL_TABLET | Freq: Three times a day (TID) | ORAL | 0 refills | Status: AC | PRN

## 2024-01-21 MED ORDER — SODIUM CHLORIDE 0.9 % IV BOLUS
1000.0000 mL | Freq: Once | INTRAVENOUS | Status: AC
  Administered 2024-01-21: 1000 mL via INTRAVENOUS

## 2024-01-21 NOTE — Discharge Instructions (Addendum)
 You were seen in the emergency department for nausea and vomiting in pregnancy You felt better after fluids and Zofran  We have called in Zofran  for you to take if your nausea and vomiting are not well-controlled with diplegia's Follow-up with your OB/GYN The baby and heart rate look good Return for persistent nausea and vomiting

## 2024-01-21 NOTE — ED Provider Notes (Signed)
 " [facility] Provider Note   CSN: [id number] Arrival date & time: 01/21/24  1341     Patient presents with: Emesis During Pregnancy and Dizziness   Casey Obrien is a 33 y.o. female.  Presenting in 16th week of pregnancy for nausea and vomiting. G3P2002 Cough rhinorrhea cong started yesterday.  Nausea and vomiting worsened today.  Diclegis  at home and effective.  Normal pregnancy thus far is followed by OB.  No abdominal pain vaginal bleeding    Dizziness      Prior to Admission medications  Medication Sig Start Date End Date Taking? Authorizing Provider  ondansetron  (ZOFRAN ) 4 MG tablet Take 1 tablet (4 mg total) by mouth every 8 (eight) hours as needed for nausea or vomiting. 01/21/24  Yes Pamella Ozell LABOR, DO  albuterol  (VENTOLIN  HFA) 108 (90 Base) MCG/ACT inhaler Inhale 2 puffs into the lungs every 6 (six) hours as needed for wheezing or shortness of breath.    [provider]  cetirizine (ZYRTEC) 10 MG tablet Take 10 mg by mouth daily. 11/15/23   [provider]  Doxylamine -Pyridoxine  (DICLEGIS ) 10-10 MG TBEC Take 2 tablets by mouth at bedtime. If symptoms persist, add one tablet in the morning and one in the afternoon 11/30/23   Cleatus Moccasin, MD  famotidine  (PEPCID ) 20 MG tablet Take 1 tablet (20 mg total) by mouth 2 (two) times daily. Patient not taking: Reported on 01/17/2024 11/30/23   Cleatus Moccasin, MD  fluticasone  (FLONASE ) 50 MCG/ACT nasal spray Spray one spray into each nostril once in the morning and once at night 01/17/24   Delores Nidia CROME, FNP  levothyroxine (SYNTHROID) 50 MCG tablet Take 1 tablet by mouth daily. Takes 4 days a week 04/30/19   [provider]  levothyroxine (SYNTHROID) 75 MCG tablet Take 75 mcg by mouth daily before breakfast. Takes 3 days a week    [provider]  ondansetron  (ZOFRAN -ODT) 4 MG disintegrating tablet Take 1 tablet (4 mg total) by mouth every 8 (eight)  hours as needed for nausea or vomiting. 11/05/23   Odell Balls, PA-C  Prenat-Fe Bisgly-FA-w/o Vit A (NESTABS ) 32-1 MG TABS Take 1 tablet by mouth daily. 08/16/23   Constant, Peggy, MD  promethazine  (PHENERGAN ) 25 MG tablet Take 1 tablet (25 mg total) by mouth every 6 (six) hours as needed for nausea or vomiting. 11/23/23   Zina Jerilynn LABOR, MD  SYMBICORT 80-4.5 MCG/ACT inhaler Inhale 2 puffs into the lungs 2 (two) times daily.    [provider]  iron  polysaccharides (NIFEREX) 150 MG capsule Take 1 capsule (150 mg total) by mouth daily. Patient not taking: Reported on 01/25/2018 05/01/17 02/21/19  Sung Hollering, CNM    Allergies: Etonogestrel     Review of Systems  Neurological:  Positive for dizziness.    Updated Vital Signs BP 101/60   Pulse 97   Temp 98 F (36.7 C) (Oral)   Resp 15   Wt 74.8 kg   LMP 09/25/2023   SpO2 98%   BMI 30.18 kg/m   Physical Exam Vitals and nursing note reviewed.  HENT:     Head: Normocephalic and atraumatic.  Eyes:     Pupils: Pupils are equal, round, and reactive to light.  Cardiovascular:     Rate and Rhythm: Normal rate and regular rhythm.  Pulmonary:     Effort: Pulmonary effort is normal.     Breath sounds: Normal breath sounds.  Abdominal:     Palpations: Abdomen is  soft.     Tenderness: There is no abdominal tenderness.     Comments: Gravid nontender abdomen  Skin:    General: Skin is warm and dry.     Comments: Erythema due to ruptured capillary beds throughout face following vomiting  Neurological:     Mental Status: She is alert.  Psychiatric:        Mood and Affect: Mood normal.     (all labs ordered are listed, but only abnormal results are displayed) Labs Reviewed  CBC WITH DIFFERENTIAL/PLATELET - Abnormal; Notable for the following components:      Result Value   RBC 3.75 (*)    Hemoglobin 10.5 (*)    HCT 30.7 (*)    All other components within normal limits  BASIC METABOLIC PANEL WITH GFR - Abnormal;  Notable for the following components:   Potassium 3.4 (*)    All other components within normal limits  HCG, QUANTITATIVE, PREGNANCY - Abnormal; Notable for the following components:   hCG, Beta Chain, Quant, S 25,498 (*)    All other components within normal limits  RESP PANEL BY RT-PCR (RSV, FLU A&B, COVID)  RVPGX2    EKG: None  Radiology: No results found.   Procedures   Medications Ordered in the ED  sodium chloride  0.9 % bolus 1,000 mL (0 mLs Intravenous Stopped 01/21/24 1730)  ondansetron  (ZOFRAN ) injection 4 mg (4 mg Intravenous Given 01/21/24 1615)                                    Medical Decision Making 33 year old female presenting in 16 weeks of pregnancy for viral symptoms and intractable vomiting.  Afebrile well-appearing.  No abdominal tenderness no vaginal bleeding.  Laboratory workup unremarkable COVID influenza RSV all negative.  Feels better after fluids and dose of Zofran  here.  Grossly normal OB exam on my ultrasound with fetal heart rate of 152.  Good fetal movement.  Will discharge with prescription for Zofran  to take if Diclegis  ineffective and instruction for OB follow-up.  Amount and/or Complexity of Data Reviewed Labs: ordered.  Risk Prescription drug management.        Final diagnoses:  Nausea and vomiting in pregnancy    ED Discharge Orders          Ordered    ondansetron  (ZOFRAN ) 4 MG tablet  Every 8 hours PRN        01/21/24 1803               Pamella Ozell LABOR, DO 01/21/24 2056  "

## 2024-01-21 NOTE — ED Triage Notes (Signed)
 Pt reports vomiting with pregnancy. Pt is [redacted] weeks pregnant.

## 2024-02-07 ENCOUNTER — Ambulatory Visit: Attending: Obstetrics and Gynecology | Admitting: Obstetrics

## 2024-02-07 ENCOUNTER — Other Ambulatory Visit: Payer: Self-pay | Admitting: *Deleted

## 2024-02-07 ENCOUNTER — Ambulatory Visit (HOSPITAL_BASED_OUTPATIENT_CLINIC_OR_DEPARTMENT_OTHER)

## 2024-02-07 VITALS — BP 122/76 | HR 109

## 2024-02-07 DIAGNOSIS — O0990 Supervision of high risk pregnancy, unspecified, unspecified trimester: Secondary | ICD-10-CM

## 2024-02-07 DIAGNOSIS — O99282 Endocrine, nutritional and metabolic diseases complicating pregnancy, second trimester: Secondary | ICD-10-CM | POA: Diagnosis not present

## 2024-02-07 DIAGNOSIS — Z98891 History of uterine scar from previous surgery: Secondary | ICD-10-CM

## 2024-02-07 DIAGNOSIS — O99212 Obesity complicating pregnancy, second trimester: Secondary | ICD-10-CM

## 2024-02-07 DIAGNOSIS — E079 Disorder of thyroid, unspecified: Secondary | ICD-10-CM | POA: Diagnosis not present

## 2024-02-07 DIAGNOSIS — O34219 Maternal care for unspecified type scar from previous cesarean delivery: Secondary | ICD-10-CM | POA: Diagnosis not present

## 2024-02-07 DIAGNOSIS — O99513 Diseases of the respiratory system complicating pregnancy, third trimester: Secondary | ICD-10-CM | POA: Diagnosis not present

## 2024-02-07 DIAGNOSIS — O99891 Other specified diseases and conditions complicating pregnancy: Secondary | ICD-10-CM | POA: Insufficient documentation

## 2024-02-07 DIAGNOSIS — Z3A19 19 weeks gestation of pregnancy: Secondary | ICD-10-CM

## 2024-02-07 DIAGNOSIS — Z363 Encounter for antenatal screening for malformations: Secondary | ICD-10-CM | POA: Diagnosis not present

## 2024-02-07 DIAGNOSIS — J45909 Unspecified asthma, uncomplicated: Secondary | ICD-10-CM | POA: Insufficient documentation

## 2024-02-07 DIAGNOSIS — E669 Obesity, unspecified: Secondary | ICD-10-CM | POA: Diagnosis not present

## 2024-02-07 DIAGNOSIS — Z362 Encounter for other antenatal screening follow-up: Secondary | ICD-10-CM

## 2024-02-07 NOTE — Progress Notes (Signed)
 MFM Consult Note  Casey Obrien is currently at [redacted]w[redacted]d. She was seen today for a detailed fetal anatomy scan due to maternal obesity and hypothyroidism.  She is currently treated with Synthroid.  She denies any problems in her current pregnancy.    She had a cell free DNA test earlier in her pregnancy which indicated a low risk for trisomy 24, 77, and 13. A female fetus is predicted.   Sonographic findings Single intrauterine pregnancy at 19w 2d. Fetal cardiac activity:  Observed and appears normal. Presentation: Breech. The views of the fetal anatomy were limited today due to the fetal position.  What was visualized today appeared within normal limits. Fetal biometry shows the estimated fetal weight of 0 lb 13 oz, 366 grams (98%). Amniotic fluid: Within normal limits.  MVP: 5.98 cm. Placenta: Anterior. Adnexa: No abnormality visualized. Cervical length: 4.1 cm.  The patient was informed that anomalies may be missed due to technical limitations. If the fetus is in a suboptimal position or maternal habitus is increased, visualization of the fetus in the maternal uterus may be impaired.  Hypothyroidism in pregnancy  She was advised to continue taking Synthroid as prescribed.    She should have her thyroid  function tests checked at least once every trimester to ensure that she is euthyroid.    Her most recent thyroid  panel drawn 1 month ago was within normal limits.  A follow-up exam was scheduled in 4 weeks to complete the views of the fetal anatomy.    All conversations were held with the patient today with the help of an Arabic interpreter.  The patient stated that all of her questions were answered.   A total of 30 minutes was spent counseling and coordinating the care for this patient.  Greater than 50% of the time was spent in direct face-to-face contact.

## 2024-02-13 ENCOUNTER — Other Ambulatory Visit: Payer: Self-pay | Admitting: Obstetrics and Gynecology

## 2024-02-13 DIAGNOSIS — R0981 Nasal congestion: Secondary | ICD-10-CM

## 2024-02-14 ENCOUNTER — Ambulatory Visit (INDEPENDENT_AMBULATORY_CARE_PROVIDER_SITE_OTHER): Payer: Self-pay | Admitting: Obstetrics and Gynecology

## 2024-02-14 ENCOUNTER — Encounter: Payer: Self-pay | Admitting: Obstetrics and Gynecology

## 2024-02-14 VITALS — BP 117/73 | HR 121 | Wt 166.8 lb

## 2024-02-14 DIAGNOSIS — Z3A20 20 weeks gestation of pregnancy: Secondary | ICD-10-CM | POA: Diagnosis not present

## 2024-02-14 DIAGNOSIS — O0990 Supervision of high risk pregnancy, unspecified, unspecified trimester: Secondary | ICD-10-CM | POA: Diagnosis not present

## 2024-02-14 DIAGNOSIS — Z758 Other problems related to medical facilities and other health care: Secondary | ICD-10-CM | POA: Diagnosis not present

## 2024-02-14 DIAGNOSIS — E079 Disorder of thyroid, unspecified: Secondary | ICD-10-CM | POA: Diagnosis not present

## 2024-02-14 DIAGNOSIS — Z98891 History of uterine scar from previous surgery: Secondary | ICD-10-CM

## 2024-02-14 DIAGNOSIS — O99019 Anemia complicating pregnancy, unspecified trimester: Secondary | ICD-10-CM | POA: Diagnosis not present

## 2024-02-14 DIAGNOSIS — Z603 Acculturation difficulty: Secondary | ICD-10-CM | POA: Diagnosis not present

## 2024-02-14 MED ORDER — ACCRUFER 30 MG PO CAPS: 30.0000 mg | ORAL_CAPSULE | Freq: Every day | ORAL | 3 refills | Status: AC

## 2024-02-14 NOTE — Progress Notes (Signed)
 Pt presents for ROB visit. Pt was seen at [facility] 01-21-24. Requesting iron  supplements

## 2024-02-14 NOTE — Progress Notes (Signed)
" ° °  PRENATAL VISIT NOTE  Subjective:  Casey Obrien is a 34 y.o. G3P2002 at [redacted]w[redacted]d being seen today for ongoing prenatal care.  She is currently monitored for the following issues for this high-risk pregnancy and has History of C-section; Language barrier; Carpal tunnel syndrome during pregnancy; Constipation; Acute blood loss anemia; Abnormal thyroid  blood test; Atypical squamous cells of undetermined significance (ASCUS) on Papanicolaou smear of cervix; Disorder of thyroid  gland; Mild intermittent asthma; Mild persistent asthma without complication; and Supervision of high risk pregnancy, antepartum on their problem list.  Patient reports no complaints.  Contractions: Not present. Vag. Bleeding: None.  Movement: Present. Denies leaking of fluid.   The following portions of the patient's history were reviewed and updated as appropriate: allergies, current medications, past family history, past medical history, past social history, past surgical history and problem list.   Objective:   Vitals:   02/14/24 1118  BP: 117/73  Pulse: (!) 121  Weight: 166 lb 12.8 oz (75.7 kg)    Fetal Status:      Movement: Present    General: Alert, oriented and cooperative. Patient is in no acute distress.  Skin: Skin is warm and dry. No rash noted.   Cardiovascular: Normal heart rate noted  Respiratory: Normal respiratory effort, no problems with respiration noted  Abdomen: Soft, gravid, appropriate for gestational age.  Pain/Pressure: Present     Pelvic: Cervical exam deferred        Extremities: Normal range of motion.  Edema: None  Mental Status: Normal mood and affect. Normal behavior. Normal judgment and thought content.    Assessment and Plan:  Pregnancy: G3P2002 at [redacted]w[redacted]d 1. Supervision of high risk pregnancy, antepartum (Primary) BP and FHR normal Doing well, feeling regular movement    2. Language barrier   3. History of C-section X2, desires RCS  Follow up growth 2/9 to complete  anatomy   4. Disorder of thyroid  gland On synthroid   5. Anemia affecting pregnancy, antepartum Start oral iron    6. [redacted] weeks gestation of pregnancy   Preterm labor symptoms and general obstetric precautions including but not limited to vaginal bleeding, contractions, leaking of fluid and fetal movement were reviewed in detail with the patient. Please refer to After Visit Summary for other counseling recommendations.   No follow-ups on file.  Future Appointments  Date Time Provider Department Center  03/06/2024 10:15 AM [facility] PROVIDER 1 [facility]  03/06/2024 10:30 AM [facility] US[facility]    Nidia Daring, FNP  "

## 2024-03-06 ENCOUNTER — Other Ambulatory Visit

## 2024-03-06 ENCOUNTER — Ambulatory Visit

## 2024-03-06 DIAGNOSIS — O0990 Supervision of high risk pregnancy, unspecified, unspecified trimester: Secondary | ICD-10-CM

## 2024-03-15 ENCOUNTER — Encounter: Payer: Self-pay | Admitting: Family Medicine
